# Patient Record
Sex: Male | Born: 1965
Health system: Southern US, Community
[De-identification: ages and names within clinical notes are randomized; demographics above are authoritative.]

## PROBLEM LIST (undated history)

## (undated) DIAGNOSIS — F1021 Alcohol dependence, in remission: Secondary | ICD-10-CM

## (undated) DIAGNOSIS — M1990 Unspecified osteoarthritis, unspecified site: Secondary | ICD-10-CM

## (undated) DIAGNOSIS — I10 Essential (primary) hypertension: Secondary | ICD-10-CM

## (undated) DIAGNOSIS — F1121 Opioid dependence, in remission: Secondary | ICD-10-CM

## (undated) DIAGNOSIS — F319 Bipolar disorder, unspecified: Secondary | ICD-10-CM

## (undated) HISTORY — PX: REPLACEMENT TOTAL KNEE: SUR1224

## (undated) HISTORY — PX: ROTATOR CUFF REPAIR: SHX139

---

## 1998-11-30 ENCOUNTER — Emergency Department (HOSPITAL_COMMUNITY): Admission: EM | Admit: 1998-11-30 | Discharge: 1998-11-30 | Payer: Self-pay | Admitting: Emergency Medicine

## 1999-02-10 ENCOUNTER — Encounter: Payer: Self-pay | Admitting: Emergency Medicine

## 1999-02-10 ENCOUNTER — Emergency Department (HOSPITAL_COMMUNITY): Admission: EM | Admit: 1999-02-10 | Discharge: 1999-02-10 | Payer: Self-pay | Admitting: Emergency Medicine

## 1999-07-25 ENCOUNTER — Emergency Department (HOSPITAL_COMMUNITY): Admission: EM | Admit: 1999-07-25 | Discharge: 1999-07-25 | Payer: Self-pay | Admitting: Emergency Medicine

## 2000-10-19 ENCOUNTER — Emergency Department (HOSPITAL_COMMUNITY): Admission: EM | Admit: 2000-10-19 | Discharge: 2000-10-19 | Payer: Self-pay | Admitting: Emergency Medicine

## 2000-12-21 ENCOUNTER — Emergency Department (HOSPITAL_COMMUNITY): Admission: EM | Admit: 2000-12-21 | Discharge: 2000-12-21 | Payer: Self-pay | Admitting: Emergency Medicine

## 2001-01-18 ENCOUNTER — Emergency Department (HOSPITAL_COMMUNITY): Admission: EM | Admit: 2001-01-18 | Discharge: 2001-01-18 | Payer: Self-pay | Admitting: Emergency Medicine

## 2003-01-24 ENCOUNTER — Encounter: Payer: Self-pay | Admitting: Emergency Medicine

## 2003-01-24 ENCOUNTER — Observation Stay (HOSPITAL_COMMUNITY): Admission: EM | Admit: 2003-01-24 | Discharge: 2003-01-25 | Payer: Self-pay

## 2003-01-26 ENCOUNTER — Encounter: Payer: Self-pay | Admitting: Family Medicine

## 2003-01-26 ENCOUNTER — Ambulatory Visit (HOSPITAL_COMMUNITY): Admission: RE | Admit: 2003-01-26 | Discharge: 2003-01-26 | Payer: Self-pay | Admitting: Family Medicine

## 2003-02-08 ENCOUNTER — Encounter: Admission: RE | Admit: 2003-02-08 | Discharge: 2003-02-08 | Payer: Self-pay | Admitting: Family Medicine

## 2003-09-13 ENCOUNTER — Emergency Department (HOSPITAL_COMMUNITY): Admission: EM | Admit: 2003-09-13 | Discharge: 2003-09-13 | Payer: Self-pay | Admitting: Emergency Medicine

## 2004-05-21 ENCOUNTER — Inpatient Hospital Stay (HOSPITAL_COMMUNITY): Admission: RE | Admit: 2004-05-21 | Discharge: 2004-05-24 | Payer: Self-pay | Admitting: Psychiatry

## 2004-07-12 ENCOUNTER — Emergency Department (HOSPITAL_COMMUNITY): Admission: EM | Admit: 2004-07-12 | Discharge: 2004-07-12 | Payer: Self-pay | Admitting: Emergency Medicine

## 2005-01-13 ENCOUNTER — Emergency Department (HOSPITAL_COMMUNITY): Admission: EM | Admit: 2005-01-13 | Discharge: 2005-01-14 | Payer: Self-pay | Admitting: Emergency Medicine

## 2005-01-14 ENCOUNTER — Ambulatory Visit: Payer: Self-pay | Admitting: Psychiatry

## 2005-01-14 ENCOUNTER — Inpatient Hospital Stay (HOSPITAL_COMMUNITY): Admission: EM | Admit: 2005-01-14 | Discharge: 2005-01-19 | Payer: Self-pay | Admitting: Psychiatry

## 2005-03-03 ENCOUNTER — Ambulatory Visit: Payer: Self-pay | Admitting: Psychiatry

## 2005-03-03 ENCOUNTER — Inpatient Hospital Stay (HOSPITAL_COMMUNITY): Admission: RE | Admit: 2005-03-03 | Discharge: 2005-03-08 | Payer: Self-pay | Admitting: Psychiatry

## 2005-03-03 ENCOUNTER — Emergency Department (HOSPITAL_COMMUNITY): Admission: EM | Admit: 2005-03-03 | Discharge: 2005-03-03 | Payer: Self-pay | Admitting: Emergency Medicine

## 2008-02-12 ENCOUNTER — Emergency Department (HOSPITAL_COMMUNITY): Admission: EM | Admit: 2008-02-12 | Discharge: 2008-02-12 | Payer: Self-pay | Admitting: Emergency Medicine

## 2008-03-18 ENCOUNTER — Emergency Department (HOSPITAL_COMMUNITY): Admission: EM | Admit: 2008-03-18 | Discharge: 2008-03-18 | Payer: Self-pay | Admitting: Emergency Medicine

## 2008-05-02 ENCOUNTER — Emergency Department (HOSPITAL_COMMUNITY): Admission: EM | Admit: 2008-05-02 | Discharge: 2008-05-02 | Payer: Self-pay | Admitting: Emergency Medicine

## 2008-12-15 ENCOUNTER — Emergency Department (HOSPITAL_COMMUNITY): Admission: EM | Admit: 2008-12-15 | Discharge: 2008-12-15 | Payer: Self-pay | Admitting: Emergency Medicine

## 2009-04-30 ENCOUNTER — Emergency Department (HOSPITAL_COMMUNITY): Admission: EM | Admit: 2009-04-30 | Discharge: 2009-04-30 | Payer: Self-pay | Admitting: Emergency Medicine

## 2009-05-01 ENCOUNTER — Ambulatory Visit (HOSPITAL_COMMUNITY): Payer: Self-pay | Admitting: Psychiatry

## 2009-05-10 ENCOUNTER — Ambulatory Visit (HOSPITAL_COMMUNITY): Payer: Self-pay | Admitting: Licensed Clinical Social Worker

## 2009-05-20 ENCOUNTER — Emergency Department (HOSPITAL_COMMUNITY): Admission: EM | Admit: 2009-05-20 | Discharge: 2009-05-20 | Payer: Self-pay | Admitting: Emergency Medicine

## 2009-05-24 ENCOUNTER — Ambulatory Visit (HOSPITAL_COMMUNITY): Payer: Self-pay | Admitting: Psychiatry

## 2009-05-30 ENCOUNTER — Ambulatory Visit (HOSPITAL_COMMUNITY): Payer: Self-pay | Admitting: Licensed Clinical Social Worker

## 2009-06-04 ENCOUNTER — Encounter: Admission: RE | Admit: 2009-06-04 | Discharge: 2009-06-04 | Payer: Self-pay | Admitting: Otolaryngology

## 2009-06-12 ENCOUNTER — Ambulatory Visit (HOSPITAL_COMMUNITY): Payer: Self-pay | Admitting: Licensed Clinical Social Worker

## 2009-06-19 ENCOUNTER — Ambulatory Visit (HOSPITAL_COMMUNITY): Payer: Self-pay | Admitting: Psychiatry

## 2009-06-21 ENCOUNTER — Inpatient Hospital Stay (HOSPITAL_COMMUNITY): Admission: EM | Admit: 2009-06-21 | Discharge: 2009-06-22 | Payer: Self-pay | Admitting: Emergency Medicine

## 2009-06-28 ENCOUNTER — Emergency Department (HOSPITAL_COMMUNITY): Admission: EM | Admit: 2009-06-28 | Discharge: 2009-06-28 | Payer: Self-pay | Admitting: Emergency Medicine

## 2009-07-03 ENCOUNTER — Encounter: Admission: RE | Admit: 2009-07-03 | Discharge: 2009-07-03 | Payer: Self-pay | Admitting: Gastroenterology

## 2009-07-03 ENCOUNTER — Ambulatory Visit (HOSPITAL_COMMUNITY): Payer: Self-pay | Admitting: Licensed Clinical Social Worker

## 2009-07-17 ENCOUNTER — Ambulatory Visit (HOSPITAL_COMMUNITY): Payer: Self-pay | Admitting: Licensed Clinical Social Worker

## 2009-07-28 ENCOUNTER — Emergency Department (HOSPITAL_COMMUNITY): Admission: EM | Admit: 2009-07-28 | Discharge: 2009-07-28 | Payer: Self-pay | Admitting: Emergency Medicine

## 2009-08-04 ENCOUNTER — Emergency Department (HOSPITAL_COMMUNITY): Admission: EM | Admit: 2009-08-04 | Discharge: 2009-08-04 | Payer: Self-pay | Admitting: Emergency Medicine

## 2009-08-07 ENCOUNTER — Ambulatory Visit (HOSPITAL_COMMUNITY): Payer: Self-pay | Admitting: Licensed Clinical Social Worker

## 2009-08-13 ENCOUNTER — Emergency Department (HOSPITAL_COMMUNITY): Admission: EM | Admit: 2009-08-13 | Discharge: 2009-08-13 | Payer: Self-pay | Admitting: Family Medicine

## 2009-08-20 ENCOUNTER — Encounter: Payer: Self-pay | Admitting: *Deleted

## 2009-08-20 DIAGNOSIS — K319 Disease of stomach and duodenum, unspecified: Secondary | ICD-10-CM | POA: Insufficient documentation

## 2009-08-20 DIAGNOSIS — Z87442 Personal history of urinary calculi: Secondary | ICD-10-CM | POA: Insufficient documentation

## 2009-08-20 DIAGNOSIS — F319 Bipolar disorder, unspecified: Secondary | ICD-10-CM | POA: Insufficient documentation

## 2009-08-20 DIAGNOSIS — F1021 Alcohol dependence, in remission: Secondary | ICD-10-CM

## 2009-08-20 DIAGNOSIS — Z96659 Presence of unspecified artificial knee joint: Secondary | ICD-10-CM

## 2009-08-20 DIAGNOSIS — I10 Essential (primary) hypertension: Secondary | ICD-10-CM | POA: Insufficient documentation

## 2009-08-20 DIAGNOSIS — Z9889 Other specified postprocedural states: Secondary | ICD-10-CM | POA: Insufficient documentation

## 2009-08-21 ENCOUNTER — Ambulatory Visit (HOSPITAL_COMMUNITY): Payer: Self-pay | Admitting: Psychiatry

## 2009-08-21 ENCOUNTER — Ambulatory Visit: Payer: Self-pay | Admitting: Pulmonary Disease

## 2009-08-21 DIAGNOSIS — J309 Allergic rhinitis, unspecified: Secondary | ICD-10-CM | POA: Insufficient documentation

## 2009-08-21 DIAGNOSIS — R519 Headache, unspecified: Secondary | ICD-10-CM | POA: Insufficient documentation

## 2009-08-21 DIAGNOSIS — R51 Headache: Secondary | ICD-10-CM

## 2009-08-21 DIAGNOSIS — G4700 Insomnia, unspecified: Secondary | ICD-10-CM

## 2009-08-23 ENCOUNTER — Ambulatory Visit (HOSPITAL_COMMUNITY): Payer: Self-pay | Admitting: Licensed Clinical Social Worker

## 2009-09-20 ENCOUNTER — Ambulatory Visit (HOSPITAL_COMMUNITY): Payer: Self-pay | Admitting: Licensed Clinical Social Worker

## 2009-10-02 ENCOUNTER — Ambulatory Visit (HOSPITAL_COMMUNITY): Payer: Self-pay | Admitting: Psychiatry

## 2009-10-10 ENCOUNTER — Ambulatory Visit (HOSPITAL_COMMUNITY): Payer: Self-pay | Admitting: Licensed Clinical Social Worker

## 2009-10-30 ENCOUNTER — Ambulatory Visit (HOSPITAL_COMMUNITY): Payer: Self-pay | Admitting: Psychiatry

## 2009-12-16 ENCOUNTER — Ambulatory Visit (HOSPITAL_COMMUNITY): Payer: Self-pay | Admitting: Psychiatry

## 2010-02-17 ENCOUNTER — Ambulatory Visit (HOSPITAL_COMMUNITY): Payer: Self-pay | Admitting: Psychiatry

## 2010-03-25 ENCOUNTER — Emergency Department (HOSPITAL_COMMUNITY): Admission: EM | Admit: 2010-03-25 | Discharge: 2010-03-25 | Payer: Self-pay | Admitting: Emergency Medicine

## 2010-04-11 ENCOUNTER — Ambulatory Visit (HOSPITAL_COMMUNITY): Payer: Self-pay | Admitting: Psychiatry

## 2010-05-14 ENCOUNTER — Encounter: Admission: RE | Admit: 2010-05-14 | Discharge: 2010-05-14 | Payer: Self-pay | Admitting: *Deleted

## 2010-07-02 ENCOUNTER — Ambulatory Visit (HOSPITAL_COMMUNITY): Payer: Self-pay | Admitting: Psychiatry

## 2010-10-11 IMAGING — CR DG LUMBAR SPINE COMPLETE 4+V
5 series · 5 of 5 positions shown · non-contrast
Comparison: None

CLINICAL DATA: Low back pain.  ER patient.

LUMBAR SPINE - COMPLETE 4+ VIEW

[t l-spine a.p.]
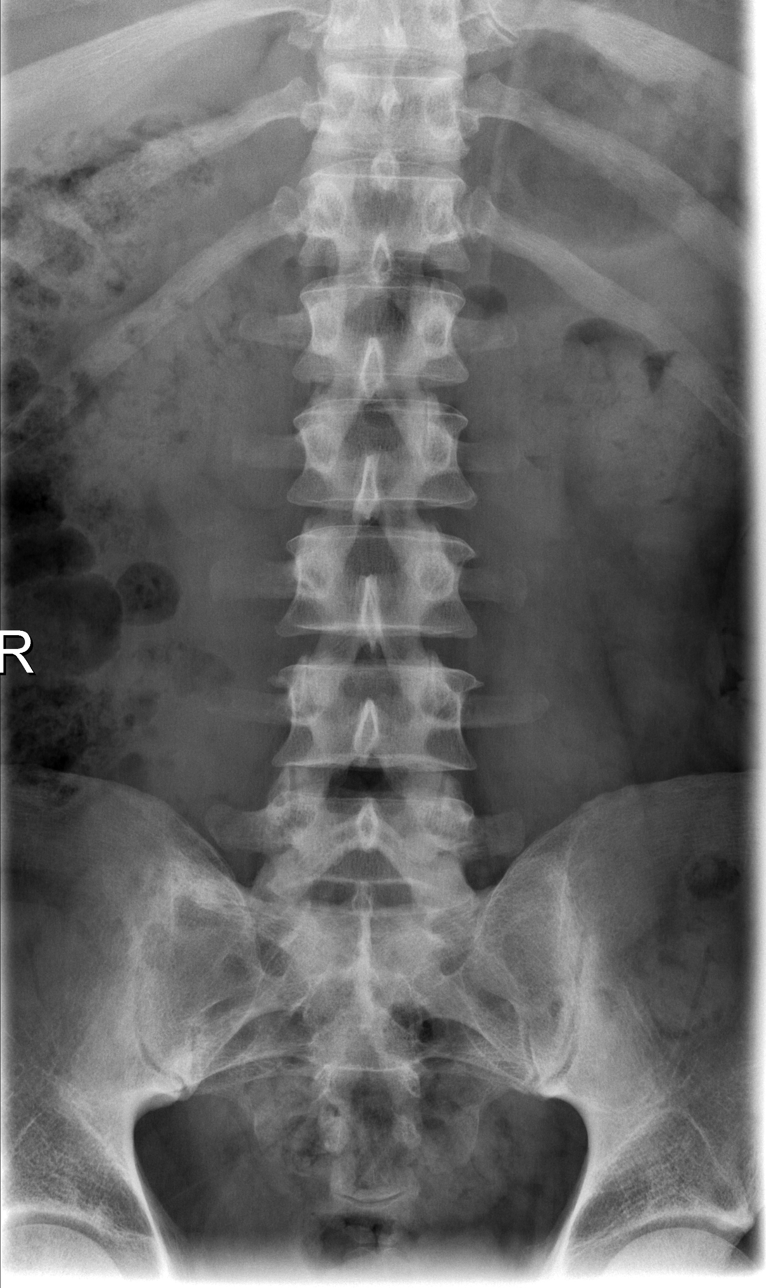

[t l-spine oblique exposure (1 of 2)]
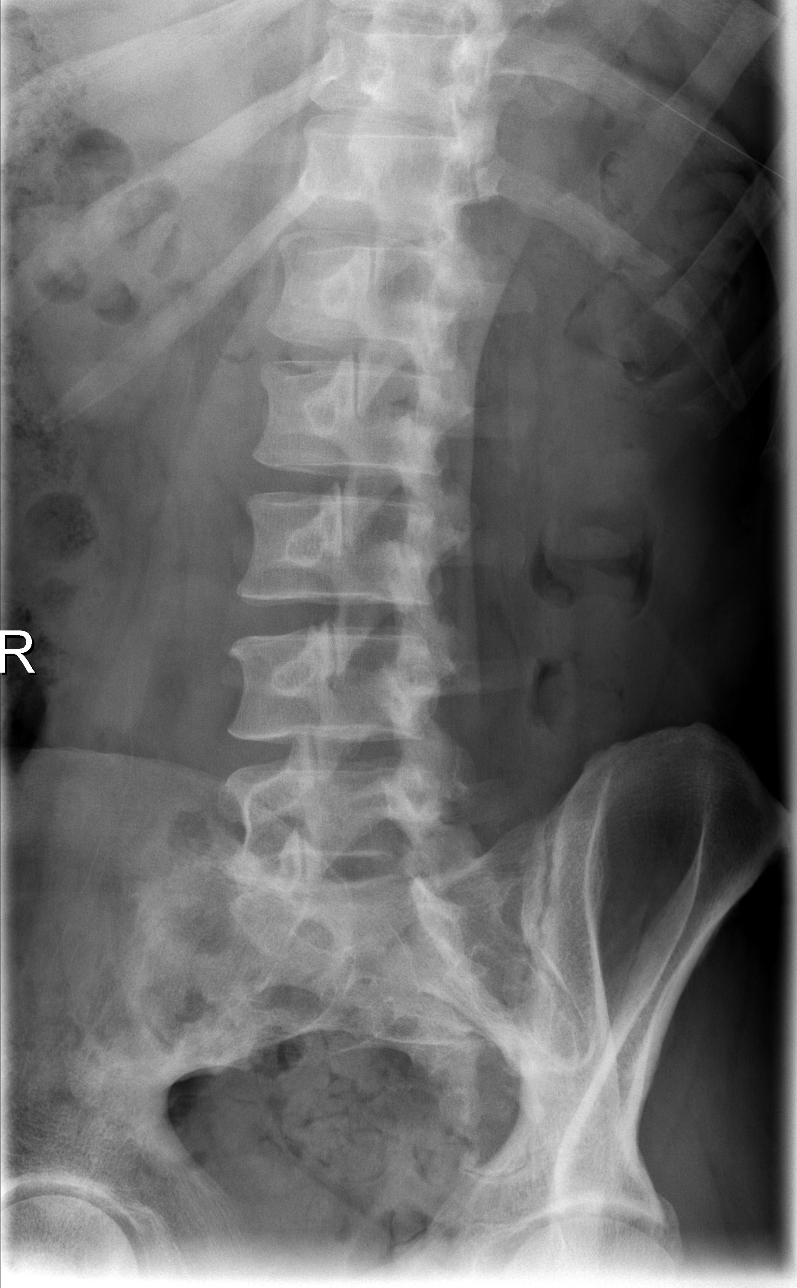

[t l-spine oblique exposure (2 of 2)]
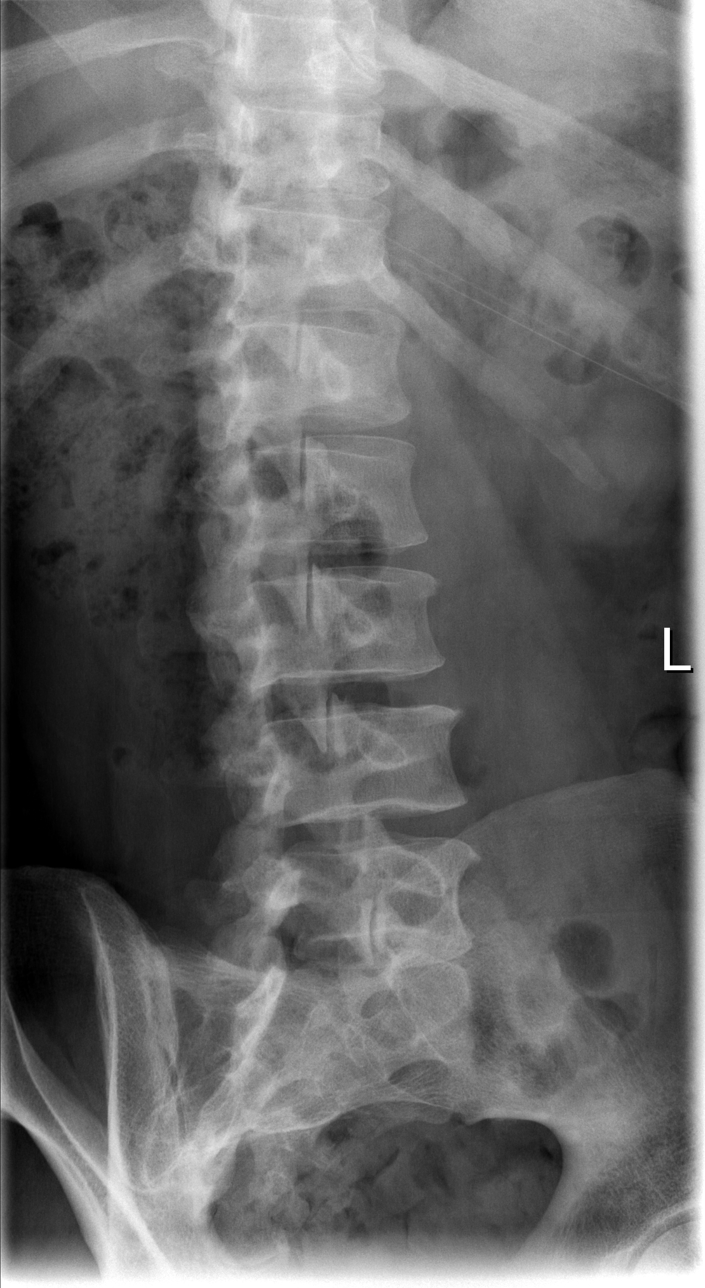

[t l-spine lat]
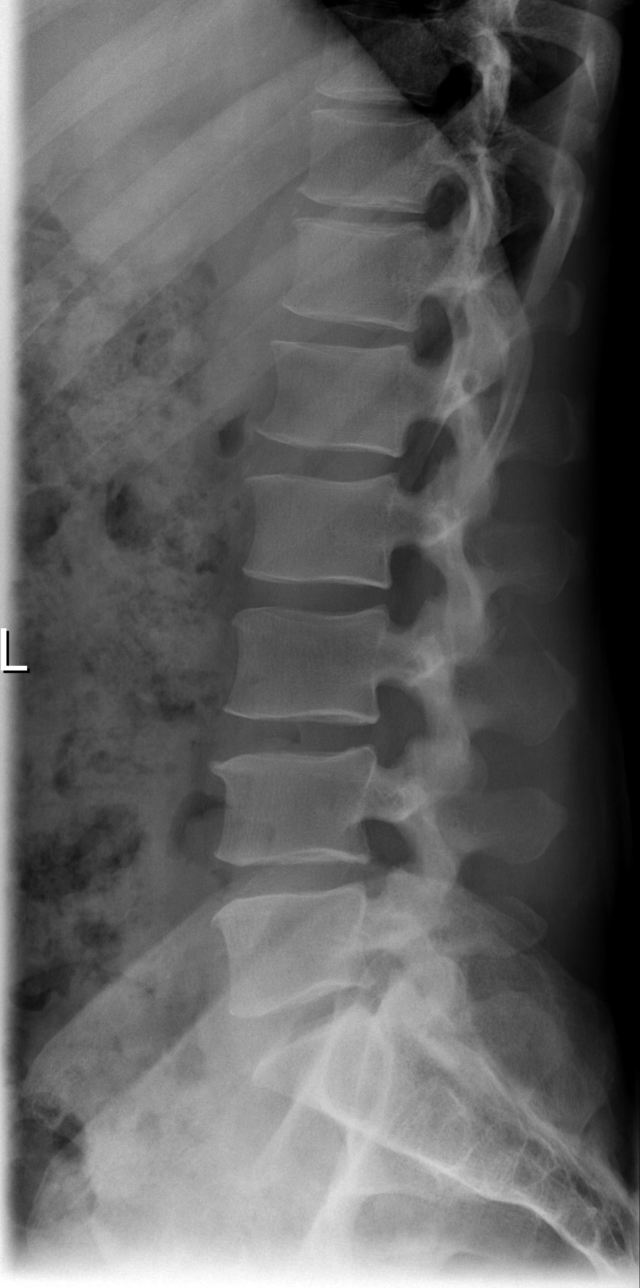

[t l-spine l5-s1 spot]
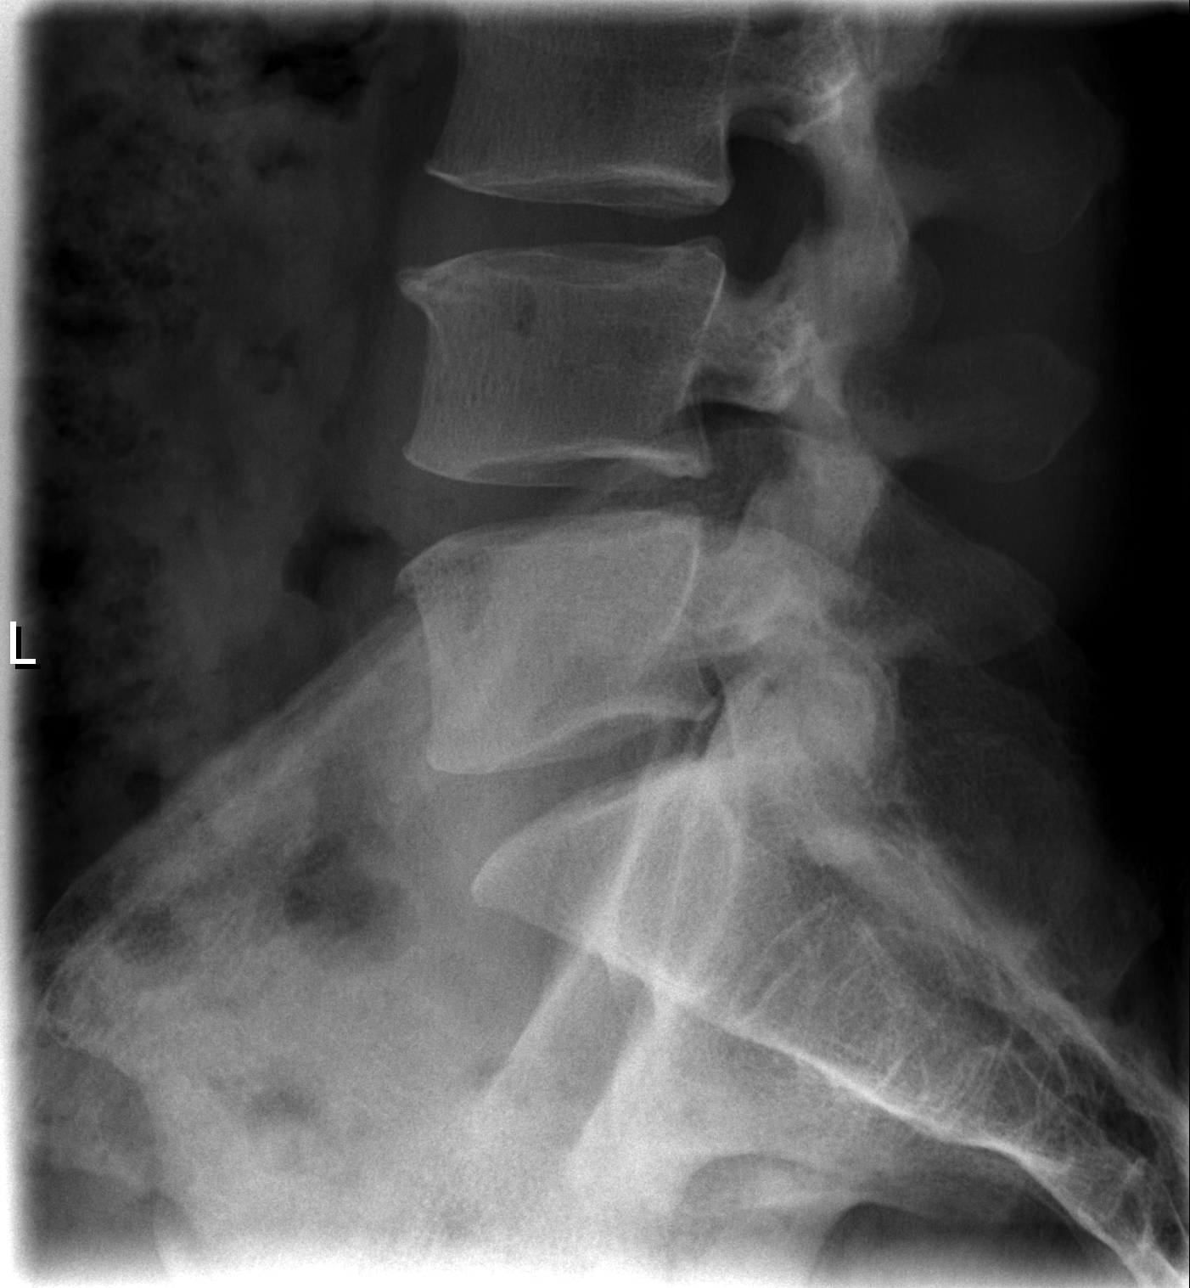

[5 of 5 positions shown; findings below may reference images not displayed]

FINDINGS: There is no evidence of lumbar spine fracture.  Alignment
is normal.  Intervertebral disc spaces are maintained.
IMPRESSION: Negative.

## 2010-10-15 ENCOUNTER — Ambulatory Visit (HOSPITAL_COMMUNITY): Payer: Self-pay | Admitting: Psychiatry

## 2010-12-08 ENCOUNTER — Ambulatory Visit (HOSPITAL_COMMUNITY): Payer: Self-pay | Admitting: Psychiatry

## 2011-02-04 ENCOUNTER — Encounter (INDEPENDENT_AMBULATORY_CARE_PROVIDER_SITE_OTHER): Payer: BC Managed Care – PPO | Admitting: Psychiatry

## 2011-02-04 DIAGNOSIS — F39 Unspecified mood [affective] disorder: Secondary | ICD-10-CM

## 2011-03-22 LAB — COMPREHENSIVE METABOLIC PANEL
Alkaline Phosphatase: 69 U/L (ref 39–117)
BUN: 15 mg/dL (ref 6–23)
CO2: 27 mEq/L (ref 19–32)
Chloride: 104 mEq/L (ref 96–112)
GFR calc Af Amer: >60 mL/min (ref >60)
Sodium: 135 mEq/L (ref 135–145)
Total Protein: 7.1 g/dL (ref 6.0–8.3)

## 2011-03-22 LAB — CBC
HCT: 46.7 % (ref 39.0–52.0)
Hemoglobin: 15.7 g/dL (ref 13.0–17.0)
MCHC: 33.6 g/dL (ref 30.0–36.0)
Platelets: 205 10*3/uL (ref 150–400)
RDW: 12.3 % (ref 11.5–15.5)
WBC: 8.3 10*3/uL (ref 4.0–10.5)

## 2011-03-22 LAB — URINALYSIS, ROUTINE W REFLEX MICROSCOPIC
Bilirubin Urine: NEGATIVE
Ketones, ur: NEGATIVE mg/dL
Nitrite: NEGATIVE
pH: 6.5 (ref 5.0–8.0)

## 2011-03-22 LAB — DIFFERENTIAL
Basophils Relative: 1 % (ref 0–1)
Eosinophils Relative: 3 % (ref 0–5)
Neutrophils Relative %: 60 % (ref 43–77)

## 2011-03-22 LAB — PROTIME-INR
INR: 0.96 (ref 0.00–1.49)
Prothrombin Time: 12.7 seconds (ref 11.6–15.2)

## 2011-03-22 LAB — LIPASE, BLOOD: Lipase: 56 U/L (ref 11–59)

## 2011-03-22 LAB — HEMOCCULT GUIAC POC 1CARD (OFFICE): Fecal Occult Bld: NEGATIVE

## 2011-04-05 LAB — COMPREHENSIVE METABOLIC PANEL
AST: 24 U/L (ref 0–37)
Albumin: 3.9 g/dL (ref 3.5–5.2)
Creatinine, Ser: 0.96 mg/dL (ref 0.4–1.5)
GFR calc Af Amer: >60 mL/min (ref >60)
Glucose, Bld: 98 mg/dL (ref 70–99)
Total Protein: 7.2 g/dL (ref 6.0–8.3)

## 2011-04-05 LAB — POCT CARDIAC MARKERS
CKMB, poc: 1.3 ng/mL (ref 1.0–8.0)
Myoglobin, poc: 56.2 ng/mL (ref 12–200)
Troponin i, poc: <0.05 ng/mL (ref 0.00–0.09)

## 2011-04-05 LAB — CBC
Hemoglobin: 15.3 g/dL (ref 13.0–17.0)
MCHC: 34.4 g/dL (ref 30.0–36.0)
MCV: 88.7 fL (ref 78.0–100.0)
Platelets: 183 10*3/uL (ref 150–400)
RDW: 13.4 % (ref 11.5–15.5)
WBC: 7.7 10*3/uL (ref 4.0–10.5)

## 2011-04-05 LAB — DIFFERENTIAL
Lymphocytes Relative: 23 % (ref 12–46)
Monocytes Relative: 7 % (ref 3–12)
Neutro Abs: 5.2 10*3/uL (ref 1.7–7.7)

## 2011-04-05 LAB — LIPASE, BLOOD: Lipase: 36 U/L (ref 11–59)

## 2011-04-06 LAB — URINALYSIS, ROUTINE W REFLEX MICROSCOPIC
Glucose, UA: NEGATIVE mg/dL
Hgb urine dipstick: NEGATIVE
Ketones, ur: NEGATIVE mg/dL
Protein, ur: NEGATIVE mg/dL
Urobilinogen, UA: 0.2 mg/dL (ref 0.0–1.0)

## 2011-04-06 LAB — COMPREHENSIVE METABOLIC PANEL
ALT: 23 U/L (ref 0–53)
AST: 24 U/L (ref 0–37)
Albumin: 3.1 g/dL — ABNORMAL LOW (ref 3.5–5.2)
Albumin: 3.9 g/dL (ref 3.5–5.2)
Alkaline Phosphatase: 44 U/L (ref 39–117)
BUN: 28 mg/dL — ABNORMAL HIGH (ref 6–23)
Calcium: 9.5 mg/dL (ref 8.4–10.5)
Chloride: 106 mEq/L (ref 96–112)
Chloride: 113 mEq/L — ABNORMAL HIGH (ref 96–112)
Creatinine, Ser: 1.06 mg/dL (ref 0.4–1.5)
GFR calc Af Amer: >60 mL/min (ref >60)
Glucose, Bld: 92 mg/dL (ref 70–99)
Potassium: 4.4 mEq/L (ref 3.5–5.1)
Sodium: 144 mEq/L (ref 135–145)
Total Bilirubin: 0.7 mg/dL (ref 0.3–1.2)
Total Bilirubin: 0.8 mg/dL (ref 0.3–1.2)
Total Protein: 5.5 g/dL — ABNORMAL LOW (ref 6.0–8.3)
Total Protein: 6.6 g/dL (ref 6.0–8.3)

## 2011-04-06 LAB — LIPID PANEL
HDL: 34 mg/dL — ABNORMAL LOW (ref >39)
LDL Cholesterol: 91 mg/dL (ref 0–99)
Total CHOL/HDL Ratio: 4.1 RATIO
Triglycerides: 77 mg/dL (ref <150)
VLDL: 15 mg/dL (ref 0–40)

## 2011-04-06 LAB — DIFFERENTIAL
Basophils Absolute: 0 10*3/uL (ref 0.0–0.1)
Basophils Absolute: 0.1 10*3/uL (ref 0.0–0.1)
Basophils Relative: 1 % (ref 0–1)
Eosinophils Absolute: 0.2 10*3/uL (ref 0.0–0.7)
Eosinophils Relative: 2 % (ref 0–5)
Lymphocytes Relative: 20 % (ref 12–46)
Lymphs Abs: 1.7 10*3/uL (ref 0.7–4.0)
Monocytes Absolute: 0.5 10*3/uL (ref 0.1–1.0)
Monocytes Absolute: 0.6 10*3/uL (ref 0.1–1.0)
Monocytes Relative: 6 % (ref 3–12)
Neutro Abs: 6.1 10*3/uL (ref 1.7–7.7)

## 2011-04-06 LAB — CBC
HCT: 43.9 % (ref 39.0–52.0)
HCT: 45.4 % (ref 39.0–52.0)
Hemoglobin: 14.8 g/dL (ref 13.0–17.0)
MCHC: 34.1 g/dL (ref 30.0–36.0)
MCV: 88.1 fL (ref 78.0–100.0)
Platelets: 150 10*3/uL (ref 150–400)
Platelets: 169 10*3/uL (ref 150–400)
RDW: 13.5 % (ref 11.5–15.5)
RDW: 13.7 % (ref 11.5–15.5)
WBC: 8.6 10*3/uL (ref 4.0–10.5)
WBC: 8.8 10*3/uL (ref 4.0–10.5)

## 2011-04-06 LAB — GLUCOSE, CAPILLARY: Glucose-Capillary: 104 mg/dL — ABNORMAL HIGH (ref 70–99)

## 2011-04-06 LAB — AMYLASE: Amylase: 63 U/L (ref 27–131)

## 2011-05-12 NOTE — H&P (Signed)
NAMEWINTER, TREFZ NO.:  [id number]   MEDICAL RECORD NO.:  [id number]          PATIENT TYPE:  EMS   LOCATION:  ED                           FACILITY:  [facility]   PHYSICIAN:  Oswald Hillock, MD        DATE OF BIRTH:  10-Mar-1966   DATE OF ADMISSION:  06/21/2009  DATE OF DISCHARGE:                              HISTORY & PHYSICAL   CHIEF COMPLAINT:  Abdominal pain.   HISTORY OF PRESENT ILLNESS:  The patient is a 45 year old Caucasian male  with known history of bipolar disorder and nephrolithiasis who presents  to the emergency room with complaint of abdominal pain.  The pain  started yesterday afternoon and he has had what he describes abdominal  cramping continuously ever since he had an incomplete bowel movement but  no change in the character of the stool.  He complaints of some  dizziness, some diaphoresis and continued nausea and weakness.  Did not  have anything to eat this morning.  He denies any fever, rigors, chills,  chest pain, shortness of breath, bleeding PR, any loss of consciousness  or any focal weakness of any part of the body.  No urinary symptoms  reported by the patient either.   EMERGENCY ROOM COURSE:  The patient's initial evaluation in the ER  revealed a low blood pressure after which he received intravenous fluids  and at the time of this interview his blood pressure is 100/60 with a  heart rate in the 40s.  He was noted to be sinus brady on his EKG.   PAST MEDICAL HISTORY:  1. Bipolar disorder.  2. Nephrolithiasis.  3. History of alcohol dependence in the past though the patient      currently denies any history of alcohol use.  4. History of stomach ulcers although no documentation of that in our      records.  5. There is documentation of previous history of hypertension and the      patient was taking hydrochlorothiazide from 2001-2003.  No current      history of that.   PAST SURGICAL HISTORY:  History of right rotator cuff  repair.   CURRENT MEDICATIONS:  1. Lithium.  2. Seroquel.   ALLERGIES:  No known drug allergies.   FAMILY HISTORY:  No history of hypertension, coronary artery disease,  diabetes mellitus or bipolar disorder in the family.   SOCIAL HISTORY:  The patient denies any alcohol, tobacco or drug use.  Lives with family.   REVIEW OF SYSTEMS:  Extensive systems are negative except for the  positives as mentioned in history of present illness.   PHYSICAL EXAMINATION:  VITAL SIGNS:  On admission pulse 68, blood  pressure 123/44, respiratory rate of 18, O2 saturation of 99% on room  air, temperature 97.6.  GENERAL:  The patient is conscious, alert, oriented to time, place and  person, in no acute distress.  HEENT:  No scleral icterus.  No pallor.  Ears negative.  Poor dental  hygiene.  NECK:  Supple.  No lymphadenopathy.  No JVD.  No carotid bruit.  CHEST:  CTA bilaterally.  CARDIOVASCULAR:  S1, S2, plus irregular bradycardia.  No gallop, rub or  murmur appreciated.  ABDOMEN:  Soft.  There is deep tenderness in the periumbilical region,  however, no guarding noted.  Bowel sounds present in all four quadrants.  EXTREMITIES:  No cyanosis, clubbing or edema.  NEUROLOGICAL:  Cranial nerves II-XII are grossly intact.  No focal motor  or sensory deficits noted on gross examination.  PSYCHIATRIC:  The patient has a flat affect.  No delusions.  No  hallucinations.  No suicidal or homicidal ideation.   LABORATORY DATA:  1. EKG showed sinus bradycardia, rate of about 54, normal PR interval,      normal QRS duration.  No previous EKGs available for comparison.  2. CT abdomen and pelvis showed no acute findings, degenerative      disease of the right hip.  3. Sodium 141, potassium 3.9, chloride 106, CO2 29, glucose 57, BUN      28, creatinine 1.06, alkaline phosphatase of 56, lipase 139.      Urinalysis was negative.  WBC count was 8.6, hemoglobin 15.5,      hematocrit 45.4, platelet count of  169, lipase was 139.   IMPRESSION AND PLAN:  This is a case of a 45 year old Caucasian male  with history of bipolar disorder and nephrolithiasis who presents with  abdominal pain.  1. Abdominal pain likely mild pancreatitis based on elevated lipase      levels.  However, he has a normal CAT scan and no significant      findings on abdominal exam.  Given his continued symptoms and the      fact that he was hypertensive in the ER we will admit him for      observation, keep him n.p.o. except meds, maintain IV fluids with      normal saline at 200 mL an hour and monitor labs.  We will consult      GI for their opinion prior to his discharge and follow up with      their recommendations.  2. The patient will be maintained on Zofran 4 mg q.8 hours p.r.n. for      nausea and we will use Dilaudid 0.5 mg IV q.4 hours p.r.n. for pain      control.  3. Bipolar disorder.  His lithium level is low.  We will continue      lithium 50 mg twice a day.  The patient seems to be stable at this      time.  We will also continue Seroquel at 50 mg daily.  4. DVT prophylaxis, Protonix and no need for any DVT prophylaxis at      this time.      Oswald Hillock, MD  Electronically Signed     BA/MEDQ  D:  06/21/2009  T:  06/21/2009  Job:  [id number]

## 2011-05-12 NOTE — Discharge Summary (Signed)
Mathew Gonzalez, STOFKO NO.:  [id number]   MEDICAL RECORD NO.:  [id number]         PATIENT TYPE:  LINP   LOCATION:                               FACILITY[facility]  [facility]   PHYSICIAN:  Eduard Clos, MDDATE OF BIRTH:  09/07/1966   DATE OF ADMISSION:  06/21/2009  DATE OF DISCHARGE:  06/22/2009                               DISCHARGE SUMMARY   HOSPITAL COURSE:  A 45 year old male with known history of bipolar  disorder and nephrolithiasis who presented with abdominal pain.  CAT  scan of the abdomen and pelvis did not show any acute findings.  The  patient had mildly elevated lipase of 139.  The patient was admitted to  medical floor and was observed.  The following day the patient's pain  had significantly improved and, as the patient was tolerating diet, the  patient was discharged home.   PROCEDURES DONE DURING THE STAY:  1. CT of abdomen and pelvis on June 21, 2009, showed negative abdomen      CT scan.  No findings significant for the patient's symptoms.  No      acute findings in the pelvis.  Negative for appendicitis.      Degenerative disk disease at the right hip noted.  2. Labs:  Lipase on admission 139, the following day it was 37.   FINAL DIAGNOSES:  1. Possible pancreatitis.  2. History of bipolar disorder.   MEDICATIONS ON DISCHARGE:  1. Lithium carbonate 450 mg twice daily.  2. Oxycodone 5 mg p.o.  p.r.n. for pain.   The patient discharged home and advised to follow with his primary care  physician within a week's time.      Eduard Clos, MD  Electronically Signed     ANK/MEDQ  D:  07/17/2009  T:  07/18/2009  Job:  [id number]

## 2011-05-15 NOTE — Discharge Summary (Signed)
NAMEANAV, LAMMERT NO.:  [id number]   MEDICAL RECORD NO.:  [id number]          PATIENT TYPE:  IPS   LOCATION:  0305                          [facility]ACILITY:  [facility]   PHYSICIAN:  Jeanice Lim, M.D. DATE OF BIRTH:  [birth year]   DATE OF ADMISSION:  03/03/2005  DATE OF DISCHARGE:  03/08/2005                                 DISCHARGE SUMMARY   IDENTIFYING DATA:  This is a 45 year old single Caucasian male voluntarily  admitted for history of alcohol abuse relapse last week after being sober  for 6 weeks.  He had been drinking a fifth a day.  Last drink was the day  prior to admission, feeling depressed, suicidal, no specific plan.  He  reports compliance with medications, although drinking a large quantity,  therefore, may be unreliable.  Third admission, here in July 2006 with  suicidality and alcohol dependence, needing detox, having been followed up  by Dr. Tomasa Rand and Dr. Dub Mikes.  He had been on Terazol, Paxil and  Wellbutrin in the past.   MEDICATIONS:  Eskalith.  Wellbutrin.  Protonix.  Claritin.  Risperdal.  Seroquel.  Trazodone.   DRUG ALLERGIES:  No known drug allergies.   PHYSICAL EXAMINATION:  Physical and neurologic exam within normal limits.   ROUTINE ADMISSION LABS:  Within normal limits.   MENTAL STATUS EXAM:  Fully alert, disheveled male, cooperative, good eye  contact.  Speech clear.  Mood tired fatigued.  Thought process goal  directed.  Thought content negative for dangerous ideation or psychotic  symptoms.  Judgment and insight were fair.   ADMISSION DIAGNOSES:   AXIS I:  1.  Bipolar disorder.  2.  Alcohol dependence.   AXIS II:  Deferred.   AXIS III:  None.   AXIS IV:  Moderate problems with psychosocial issues including limited  support system and financial stress.   AXIS V:  35/65.   HOSPITAL COURSE:  Patient was admitted, ordered routine p.r.n. medications,  underwent further monitoring, was encouraged to participate in  individual,  group and milieu therapy.  He require detox protocol for safety.  He worked  on relapse Astronomer.  Participated in substance abuse therapy as well  as aftercare planning and importance of compliance with medications,  aftercare and working 100% on relapse prevention plan were reviewed with  patient on several occasions.  Employer was contacted at patient's request  regarding limited details but no defying of patient's location.  Patient  reported improved mood stability, resolution of withdrawal symptoms and was  discharged in improved condition with no dangerous ideations or psychotic  symptoms.   DISCHARGE MEDICATIONS:  Patient was given medication education on multiple  occasions and was discharged on  1.  Eskalith CR 450 q.12h.  2.  Wellbutrin XL 150 mg q.a.m.  3.  Seroquel 100 mg 2-1/2 tablets q.h.s.  4.  Neurontin 200 mg 1 t.i.d. and 2 q.h.s.   FOLLOW UP:  He is to follow up with Lucretia Roers on Monday, March 09, 2005, at [facility], and also on Monday, March 09, 2005, at 4:00 p.m.  CD  IOP starting 6:00 to 9:00 p.m., and to see Dr. Dub Mikes Wednesday, March 18, 2005.   DISCHARGE DIAGNOSES:   AXIS I:  1.  Bipolar disorder.  2.  Alcohol dependence.   AXIS II:  Deferred.   AXIS III:  None.   AXIS IV:  Moderate problems with psychosocial issues including limited  support system and financial stress.   AXIS V:  Global assessment of function on discharge 55-60.      JEM/MEDQ  D:  04/13/2005  T:  04/14/2005  Job:  [id number]

## 2011-05-15 NOTE — Discharge Summary (Signed)
NAME:  Mathew Gonzalez, Mathew Gonzalez                          ACCOUNT NO.:  [id number]   MEDICAL RECORD NO.:  [id number]                   PATIENT TYPE:  IPS   LOCATION:  0505                                 [facility]ILITY[facility]  [facility]   PHYSICIAN:  Geoffery Lyons, M.D.                   DATE OF BIRTH:  1966/03/22   DATE OF ADMISSION:  05/21/2004  DATE OF DISCHARGE:  05/24/2004                                 DISCHARGE SUMMARY   CHIEF COMPLAINT AND PRESENT ILLNESS:  This was the first admission to [facility] for this 45 year old single white male voluntarily  admitted.  History of alcohol abuse.  He wants help to stop drinking.  Feeling very depressed, denying any suicidal ideation.  He lost over 20  pounds with decreased sleep, no specific stressors, no hallucinations.  Felt  like he needs an antidepressant and has been on that at one point in time  but had side effects.  Currently experiencing withdrawal symptoms.   PAST PSYCHIATRIC HISTORY:  First time at [facility].  Second  detoxification.  Detoxified back in 1990.  Longest history of sobriety has  been three years.   SUBSTANCE ABUSE HISTORY:  Drinks vodka, a pint or more daily.  Drinking in  the morning.  Last drink was on May 21, 2004.  Drinking for years.  Denies  any blackouts or seizure activities.   PAST MEDICAL HISTORY:  Noncontributory.   MEDICATIONS:  Has been on Serzone, Paxil and Wellbutrin in the past.  None  currently.   LABORATORY DATA:  CBC within normal limits.  Blood chemistry within normal  limits.  SGOT 88, SGPT 108.  TSH within normal limits.  Drug screen negative  for substances of abuse.   MENTAL STATUS EXAM:  Alert, cooperative male with fair eye contact.  Speech  was soft-spoken, more relevant.  Mood was depressed.  Affect was depressed.  Thought processes were coherent.  There appears to be no evidence of  psychosis.  Cognition well-preserved.   ADMISSION DIAGNOSES:   AXIS I:  1. Alcohol  dependence.  2. Depressive disorder not otherwise specified.   AXIS II:  No diagnosis.   AXIS III:  No diagnosis.   AXIS IV:  Moderate.   AXIS V:  Global Assessment of Functioning upon admission 35; highest Global  Assessment of Functioning in the last year 65.   HOSPITAL COURSE:  He was admitted and started intensive individual and group  psychotherapy.  He was given Librium for detox.  He was given some Ambien  for sleep.  He was started on Cymbalta 20 mg per day, that was increased up  to 30 mg.  Endorsed use of alcohol for many years, sober in 1990 for three  years and then in 1998 for a few months.  Heavy drinking secondary to stress  at job, rough relationship with the girlfriend.  Has been on  antidepressants, Prozac, Serzone, Paxil, Wellbutrin and Zoloft.  Stopped  secondary to side effects.  Endorsed feeling a lot of guilt, decreased self-  esteem, withdrawn, hopeless, helpless.  He continued to be depressed,  tearful, dysphoric, withdrawn, some withdrawal symptoms that started  resolving.  He was started on Cymbalta.  By May 24, 2004, he felt he was  feeling better.  He wanted to be back at work.  Endorsed increased insight  in terms of the need to abstain.  Was going to work on a plan for long-term  abstinence.  He was going to stay with the parents.  Will go to meetings.  As he was not suicidal or homicidal and there was no acute withdrawal, we  went ahead and discharged to outpatient follow-up.   DISCHARGE DIAGNOSES:   AXIS I:  1. Alcohol dependence.  2. Depressive disorder not otherwise specified.   AXIS II:  No diagnosis.   AXIS III:  No diagnosis.   AXIS IV:  Moderate.   AXIS V:  Global Assessment of Functioning upon discharge 50.   DISCHARGE MEDICATIONS:  1. Protonix 40 mg per day.  2. Cymbalta 30 mg per day.  3. Antabuse 250 mg daily.  4. Trazodone 100 mg, 1-2 at night.   FOLLOW UP:  Jasmine Pang, M.D.                                                Geoffery Lyons, M.D.    IL/MEDQ  D:  06/17/2004  T:  06/19/2004  Job:  [id number]

## 2011-05-15 NOTE — H&P (Signed)
Mathew Gonzalez, WAGES                ACCOUNT NO.:  [id number]   MEDICAL RECORD NO.:  [id number]          PATIENT TYPE:  IPS   LOCATION:  0305                          [facility]ILITY[facility]  [facility]   PHYSICIAN:  Syed T. Arfeen, M.D.   DATE OF BIRTH:  11/13/1966   DATE OF ADMISSION:  03/03/2005  DATE OF DISCHARGE:                         PSYCHIATRIC ADMISSION ASSESSMENT   IDENTIFYING INFORMATION:  A 45 year old single white male voluntarily  admitted on 03/03/05.   HISTORY OF PRESENT ILLNESS:  Patient presents with a history of alcohol  abuse.  Patient relapsed last week after being sober for approximately 6  weeks.  Patient has been drinking liquor, drinking a fifth daily.  His last  drink was on 03/03/05 at 7:00 in the morning.  Patient does report that he has  been drinking in the early morning hours.  He feels depressed with suicidal  thoughts, no specific plan.  He reports he has been compliant with his  medications.  His sleep has been satisfactory.  Appetite has been  satisfactory.  He denies any psychotic symptoms.   PAST PSYCHIATRIC HISTORY:  Third admission, patient was here in January 2006  for suicidal thoughts and alcohol abuse.  He sees Dr. Dub Mikes on an outpatient  basis, no history of a suicide attempt.  His longest history of sobriety had  been 3 years which was 32 to 1997.  In the past patient has been on  Serzone, Paxil, and Wellbutrin.   SOCIAL HISTORY:  This is a 45 year old single white male with 2 children who  are ages 41 and 73.  He lives alone.  He works in a [employer].  No legal  problems and reports a supportive family.   FAMILY HISTORY:  History of alcohol use.   ALCOHOL OR DRUG HISTORY:  Patient is a nonsmoker.  Alcohol use as above.  Drinking since the age of 39.  Drinks in the morning with blackouts, no  seizures.  He has had withdrawal symptoms in the past.  He denies any drug  use.   PRIMARY CARE PHYSICIAN:  None.   MEDICAL PROBLEMS:  Headaches.   MEDICATIONS:   Patient was discharged in January on:  1.  Eskalith CR 450 b.i.d.  2.  Wellbutrin XL 300 mg in the morning.  3.  Protonix 40 mg daily.  4.  Claritin 10 mg daily.  5.  Risperdal 0.5 mg in the morning and 1.5 mg at bedtime.  6.  Seroquel 200 mg at h.s.  7.  Trazodone 100 mg at h.s.   DRUG ALLERGIES:  No known drug allergies.   PHYSICAL EXAMINATION:  Patient was assessed at [facility] where he received  1 gm of Tylenol and Dilaudid 4 mg for a headache.  He is a 45 year old male  in no acute distress, unkempt but no tremors.  His temperature 99, heart  rate 81, respirations 20, blood pressure 141/98.  He is 98% saturated.  Alcohol level is 164.  Urine drug screen was negative.  Blood sugar is 118.  Lithium level was less than 0.25.  TSH is 6.257.   MENTAL  STATUS EXAM:  Fully alert, disheveled male cooperative.  Good eye  contact.  Speech is clear, normal pace and tone.  Patient feels tired.  Patient also appears tired and flat.  Thought processes are coherent with no  evidence of psychosis.  Cognition is intact.  Memory is fair.  Judgment and  insight are fair.  Rule out poor impulse control.   DIAGNOSES:   AXIS I:  1.  Bipolar disorder.  2.  Alcohol abuse, rule out dependence.   AXIS II:  Deferred.   AXIS III:  None.   AXIS IV:  Other psychosocial problems related to chronic alcohol use,  possible problems with primary support group.   AXIS V:  Current is 36, past year 31.   PLAN:  Admission for suicidal thoughts, alcohol use.  We will detox the  patient safely, work on relapse prevention.  Patient is to increase coping  skills by attending groups.  Medication compliance was reinforced as that  may have contributed to relapsing.  Case manager is to look at any rehab  programs available to patient.  Patient should follow up with support from  [facility].  Tentative length of stay 4-5 days.      JO/MEDQ  D:  03/06/2005  T:  03/06/2005  Job:  [id number]

## 2011-05-15 NOTE — Discharge Summary (Signed)
NAME:  Mathew Gonzalez, Mathew Gonzalez                          ACCOUNT NO.:  [id number]   MEDICAL RECORD NO.:  [id number]                   PATIENT TYPE:  OBV   LOCATION:  4710                                 FACILITY:  [facility]   PHYSICIAN:  Leighton Roach McDiarmid, M.D.             DATE OF BIRTH:  08-24-1966   DATE OF ADMISSION:  01/24/2003  DATE OF DISCHARGE:  01/25/2003                                 DISCHARGE SUMMARY   DISCHARGE MEDICATIONS:  1. Metoprolol 50 mg one p.o. b.i.d.  2. HCTZ 12.5 mg one p.o. daily.  3. Prevacid 30 mg one p.o. b.i.d. for two weeks.   DISCHARGE DIAGNOSES:  1. Chest pain rule out myocardial infarction.  2. Epigastric pain.  3. Hypertension.  4. Anxiety.   DISCHARGE INSTRUCTIONS:  The patient may take Tylenol as needed for pain 650  mg q.4h.  Right upper quadrant ultrasound scheduled for January 26, 2003, at  [facility] at 8:45 outpatient admitting.  The patient is to be NPO after  midnight tonight.  The patient is to make follow up appointment at [facility] to be seen in the next two weeks.   HISTORY OF PRESENT ILLNESS:  Mathew Gonzalez is a 45 year old white male with past  medical history significant for hypertension.  He used to be on  hydrochlorothiazide and stopped taking it several months ago.  He presented  with epigastric pain that radiated to his left shoulder and started after  eating lunch on the day of admission.  He described his pain as a 10/10  reduced to a 5/10 after nitroglycerin x1 in the emergency department.  He  described an associated weakness in his legs and paresthesias in his upper  extremities.  The epigastric pain was not associated with physical labor.  It was, however, associated with diaphoresis and dyspnea.  He has had  several episodes of gastroesophageal reflux in the past and has taken over  the counter medications for this.  He had no associated nausea or vomiting,  and denied eating any spicy foods.  His pain did not change with  position or  deep inspiration, and did not change with belching.  The patient describes  responding well to hydrochlorothiazide for blood pressure in the past as he  had been followed closely at __________ and [facility].   REVIEW OF SYSTEMS:  GENERAL:  Significant for a 40 pound weight loss over  the last year as the patient has been trying to lose weight by diet.  Denied  any fevers or chills or night sweats.  CARDIOVASCULAR:  He was complaining  of palpitations and chest pain on admission with left shoulder soreness.  LUNGS:  He was complaining of shortness of breath and tachypnea on  presentation.  GI:  No nausea and vomiting, diarrhea, no bloating or  belching.  Positive for history of GERD.  MUSCULOSKELETAL:  Denied any  recent heavy lifting.  Positive for left muscle soreness in his shoulder.   PAST MEDICAL HISTORY:  Only significant for hypertension.   CURRENT MEDICATIONS:  The only medications he had been taking was over the  counter Pepcid and ibuprofen.   HOSPITAL COURSE:  Problem 1.  Chest pain, rule out myocardial infarction.  The patient received nitroglycerin in the emergency department as well as  aspirin and oxygen.  He received a GI Cocktail after pain remained at a  5/10, and quickly decreased to 1/10.  An EKG was done on presentation which  showed sinus tachycardia without any ischemic changes.  Chest x-ray done in  the emergency department showed no active disease.  Cardiac enzymes were  obtained x3 and were negative except for slight bump in the creatinine  kinase.  A repeat EKG done on hospital day #2 showed normal sinus rhythm  with a heart rate of 58 and no associated ischemic changes.  He remained on  aspirin daily as well as O2 as needed until ruled out.  The likely cause for  his chest pain/epigastric pain was reflux, as he responded well to a GI  Cocktail.  While in the hospital, he received Pepcid b.i.d. and will be sent  home on Prevacid  b.i.d. dosage for two weeks.  If he responds well to this,  he may remain on for four to six weeks.  In order to risk stratify him for  cardiac causes, a fasting lipid panel was obtained which did not show any  elevation in his cholesterol or triglycerides.  Other diagnoses in our  differential included chest wall pain, peptic ulcer disease, esophageal  spasms, anxiety, and panic attacks.  The patient did present with flushing,  palpitations, and tachycardia which would correspond well with anxiety and  panic attacks as he was given Ativan in the emergency department and it did  help.  However, having anxiety in the setting of acute chest pain is not  enough to diagnose him with panic attacks.   Problem 2.  Hypokalemia.  His potassium on admission was 3.1.  He was orally  repleted and responded well.   Problem 3.  Hypertension.  His blood pressure on admission was 199/107.  After given nitroglycerin and Ativan, his blood pressure on recheck was  131/78 and remained stable during his stay.  He was placed on metoprolol and  will continue as an outpatient.  Because his blood pressure still remained  in the 140s/80s, HCTZ at 12.5 mg p.o. daily was added to his blood pressure  regimen.  This should be followed up as an outpatient.   Problem 4.  Sinus tachycardia.  He was given metoprolol and heart rate  rapidly improved from the 130s down to the 90s and then finally down to 58.  The patient remained asymptomatic with heart rate of 58.   Problem 5.  Elevated liver enzymes.  With an AST of 69 and ALT of 117, I  checked a hepatitis panel on him to rule out any viral hepatitis causes.  All were negative.   Problem 6.  Proteinuria.  This was likely secondary to uncontrolled  hypertension.  He had a normal renal function with BUN of 8 and creatinine  of 1.1.  May follow up as an outpatient.   Problem 7.  Rule out biliary colic as cause for his epigastric pain.  Obtain a right upper quadrant  ultrasound on January 30, follow up results as  outpatient.   FOLLOW  UP ITEMS:  1. Results of the right upper quadrant ultrasound to rule out biliary colic     as the cause of his epigastric pain.  2. He is currently on a b.i.d. dosing of Prevacid for two weeks.  If he     responds well, he remain on for longer to cover for peptic ulcer disease.     Also consider EGD in the future if he continues to have epigastric pain.  3. Close follow up on his smoking and alcohol abuse.     Lorne Skeens, D.O.                         Etta Grandchild, M.D.    Erick Alley  D:  01/26/2003  T:  01/27/2003  Job:  [id number]   cc:   [facility]

## 2011-05-15 NOTE — Discharge Summary (Signed)
Mathew Gonzalez, Mathew Gonzalez NO.:  [id number]   MEDICAL RECORD NO.:  [id number]          PATIENT TYPE:  IPS   LOCATION:  0306                          [facility]ACILITY[facility]  [facility]   PHYSICIAN:  Geoffery Lyons, M.D.      DATE OF BIRTH:  [birth year]   DATE OF ADMISSION:  01/14/2005  DATE OF DISCHARGE:  01/19/2005                                 DISCHARGE SUMMARY   CHIEF COMPLAINT AND PRESENT ILLNESS:  This was the second admission to [facility] for this 45 year old single male referred by PCP.  Called a friend, having a nervous breakdown with suicidal ideation with  thoughts of shooting.  History of alcohol use since age 65.  Longest sober  three years.  Drinking constantly for a few days, 50-60 beers, three bottles  of wine.  Stopped his medication, Lexapro, one week prior to this admission.  Thinking about taking a shotgun to himself.  Was using Klonopin and ran out  of it.   PAST PSYCHIATRIC HISTORY:  Seeing Tiajuana Amass.  Second time at  [facility].  Last time May 25th through the 28th of 2005.  Has been  on Serzone, Paxil, Wellbutrin, Zoloft, Prozac.  Stopped Lexapro due to side  effects, mainly sexual.  Had been on Antabuse in May.  Detoxed twice.   ALCOHOL/DRUG HISTORY:  As already stated.  Recurrent use of alcohol.  Last  relapse was the one that brought him to the hospital.  Drank 50-60 beers and  three bottles of wine in 4-5 days.   MEDICAL HISTORY:  Arterial hypertension.   MEDICATIONS:  Klonopin 1 mg twice a day, Equetro 200 mg twice a day, Lexapro  5 mg, decreased from 10 mg (stopped one week prior to this admission).   PHYSICAL EXAMINATION:  Performed and failed to show any acute findings.   LABORATORY DATA:  Blood chemistry within normal limits.  Liver profile  within normal limits.  TSH 2.475.   MENTAL STATUS EXAM:  Fully alert, pleasant, cooperative male.  Speech normal  rate, tempo and production.  Mood depressed, anxious.  Affect  depressed,  anxious.  Thought processes logical, coherent and relevant.  Suicidal  ruminations but can contract for safety.  No delusions.  No hallucinations.  Cognition was well-preserved.   ADMISSION DIAGNOSES:   AXIS I:  1.  Bipolar disorder, type 2.  2.  Anxiety disorder not otherwise specified.  3.  Alcohol abuse.   AXIS II:  No diagnosis.   AXIS III:  Headaches.   AXIS IV:  Moderate.   AXIS V:  Global Assessment of Functioning upon admission 28; highest Global  Assessment of Functioning in the last year 75.   HOSPITAL COURSE:  He was admitted and started in individual and group  psychotherapy.  He was maintained on Equetro.  He was detoxed with Librium.  He was given trazodone for sleep.  Started on Wellbutrin XL 150 mg in the  morning.  He was given some Risperdal 0.25 mg twice a day.  Equetro was  increased to 200 mg three times a day.  He did endorse he quit taking his  Lexapro due to sexual side effects.  Started getting more and more  depressed.  He relapsed on alcohol.  Claimed that the depression got to him.  Was willing to take Wellbutrin as he had taken it before.  The depression  was getting to the point where it was interfering with his ability to  function.  Two children that stayed with the grandmother.  Endorsed the  hallucinations.  Vague hallucinations of noises.  He was wanted to explore  the possibility of using lithium.  We went ahead and started lithium 300 mg  twice a day.  He was placed on Risperdal 0.25 mg twice a day and Equetro was  increased to 200 mg three times a day.  On January 23rd, he endorsed he was  feeling much better.  No suicidal ideation.  No homicidal ideation.  No  hallucinations.  No delusions.  Willing to work on long-term abstinence.  He  was back and working the program.  Got involved with a sponsor.  Overall was  tolerating medications well.   DISCHARGE DIAGNOSES:   AXIS I:  1.  Bipolar disorder not otherwise specified.   2.  Anxiety disorder not otherwise specified.  3.  Alcohol abuse.   AXIS II:  No diagnosis.   AXIS III:  Headaches.   AXIS IV:  Moderate.   AXIS V:  Global Assessment of Functioning upon discharge 50.   DISCHARGE MEDICATIONS:  1.  Eskalith CR 450 mg, 2 a day.  2.  Wellbutrin XL 300 mg in the morning.  3.  Protonix 40 mg per day.  4.  Claritin 10 mg daily.  5.  Risperdal 0.5 mg in the morning and 1.5 mg at night.  6.  Seroquel 200 mg at bedtime.  7.  Trazodone 100 mg at bedtime as needed for sleep.   FOLLOW UP:  Dr. Tomasa Rand.      IL/MEDQ  D:  02/18/2005  T:  02/18/2005  Job:  [id number]

## 2011-05-15 NOTE — H&P (Signed)
NAME:  Mathew Gonzalez, Mathew Gonzalez                          ACCOUNT NO.:  [id number]   MEDICAL RECORD NO.:  [id number]                   PATIENT TYPE:  IPS   LOCATION:  0505                                 [facility]ILITY[facility]  [facility]   PHYSICIAN:  Geoffery Lyons, M.D.                   DATE OF BIRTH:  13-Feb-1966   DATE OF ADMISSION:  05/21/2004  DATE OF DISCHARGE:                         PSYCHIATRIC ADMISSION ASSESSMENT   IDENTIFYING. INFORMATION:  The patient is a 45 year old single white male  voluntarily admitted on May 21, 2004.   HISTORY OF PRESENT ILLNESS:  The patient presents with a history of alcohol  abuse.  The patient states he wants help to stop drinking.  He is feeling  very depressed.  He denies any suicidal ideation at this time.  He states he  has lost over 20 pounds with decreased sleep.  He identifies no specific  stressors.  He denies any hallucinations.  He states that he feels like he  needs an antidepressant; has been on them at one point in time but had side  effects.  The patient is currently experiencing withdrawal symptoms at this  time.   PAST PSYCHIATRIC HISTORY:  This is the first hospitalization at [facility].  This is his secondary detoxification; was detoxified back in  the 1990s.  Longest history of sobriety has been three years.   SUBSTANCE ABUSE HISTORY:  The patient dips snuff.  He drink vodka, a pint or  more daily.  He does drink in the morning.  His last drink was on May 21, 2004.  He has been drinking for years.  He denies any blackouts.  No seizure  activity.  No drug use.   PAST MEDICAL HISTORY:  Primary care Naiyah Klostermann: None.  Medical problems: None.   MEDICATIONS:  None.  The patient has been on Serzone, Paxil, and Wellbutrin  in the past.   DRUG ALLERGIES:  No known allergies.   PHYSICAL EXAMINATION:  GENERAL:  The patient appears disheveled.  His face  appears somewhat red.  He has some mild upper extremity tremors.  VITAL SIGNS:  Temperature  97.7, heart rate 78, respirations 22, blood  pressure 138/88.  He is 233 pounds.  He is 6 feet tall.   LABORATORY DATA:  Labs are pending.   SOCIAL HISTORY:  This is a 45 year old single white male with two children.  He has a roommate.  He works in a [employer].  No legal problems.   FAMILY HISTORY:  He reports bipolar disorder.   MENTAL STATUS EXAM:  He is an alert, cooperative male, fair eye contact,  disheveled.  Speech is soft spoken but relevant.  Mood is depressed.  Affect  is teary-eyed at times.  Thought processes are coherent.  There appears to  be no evidence of psychosis.  Cognitive functioning: Intact.  Memory is  fair.  Judgment is fair.  Insight is  fair.   ADMISSION DIAGNOSES:   AXIS I:  1. Depressive disorder, not otherwise specified.  2. Alcohol dependence.   AXIS II:  Deferred.   AXIS III:  None.   AXIS IV:  Other psychosocial problems related to alcohol use.   AXIS V:  Current is 35, estimated this past year is 60.   INITIAL PLAN OF CARE:  Plan is a voluntary admission to [facility] for depression and alcohol abuse.  Contract for safety.  Will  stabilize and detoxify with Librium.  Will encourage fluids.  Will work on  relapse prevention.  The patient is to attend groups.  The patient is to  follow up with AA and remain alcohol free.  Will initiate an antidepressant  or discuss use with risks and benefits along with medication compliance when  is physically feeling better.   ESTIMATED LENGTH OF STAY:  Three to five days.     Landry Corporal, N.P.                       Geoffery Lyons, M.D.    JO/MEDQ  D:  05/21/2004  T:  05/21/2004  Job:  [id number]

## 2011-05-15 NOTE — H&P (Signed)
NAME:  Mathew Gonzalez, Mathew Gonzalez                          ACCOUNT NO.:  [id number]   MEDICAL RECORD NO.:  [id number]                   PATIENT TYPE:  INP   LOCATION:  1823                                 FACILITY:  [facility]   PHYSICIAN:  Leighton Roach McDiarmid, M.D.             DATE OF BIRTH:  May 24, 1966   DATE OF ADMISSION:  01/24/2003  DATE OF DISCHARGE:                                HISTORY & PHYSICAL   PRIMARY CARE PHYSICIAN:  [facility]   CHIEF COMPLAINT:  Chest pain.   HISTORY OF PRESENT ILLNESS:  This is a 45 year old white male with a past  medical history significant for hypertension who used to be on  hydrochlorothiazide and stopped taking it several months ago.  He presented  with epigastric pain that radiated to his left shoulder.  The pain started  after eating lunch today.  The pain was constant in nature, rated at a 10/10  for several hours, finally relieved to a 5/10 after nitroglycerin x1 in the  emergency department.  His chest pain was associated with weakness in his  legs and paresthesias in the upper extremities, which he says were new.  His  chest pain occurred while at work today, and he explained that he was not  doing any physical labor.  It was associated with diaphoresis and trouble  breathing.  He has had several episodes of epigastric pain in the past and  one ER visit for heartburn.  He describes taking Pepcid and Prilosec OTC in  the past, although only occasionally uses them, and says that they do help  his heartburn.  He has no associated nausea or vomiting.  The chest pain  does not change with position or deep inspiration.  He denies eating any  spicy foods, and denies any change in the chest pain with belching.  He  reported that he discontinued his hydrochlorothiazide because he lost weight  and his blood pressure improved during his last two visits to [facility] several months ago.   REVIEW OF SYSTEMS:  CONSTITUTIONAL:  No fevers  or chills.  A 40 pound weight  loss over the last one year, trying to lose with diet.  CARDIOVASCULAR:  Positive for palpitations, chest pain.  LUNGS:  Denied coughing, URI  symptoms.  Positive for shortness of breath and tachypnea on presentation.  SKIN:  Flush, on presentation, in the face.  GASTROINTESTINAL:  Denies  nausea, vomiting, or diarrhea.  Denies bloating or belching.  Positive for  history of GERD.  GENITOURINARY:  Denies dysuria or hematuria.  ENDOCRINE:  Denies any history of diabetes.  MUSCULOSKELETAL:  Denies any recent heavy  lifting.  Left shoulder slightly sore.   PAST MEDICAL HISTORY:  Hypertension.  Was taking hydrochlorothiazide,  questionable from 2001 to 2003.   PAST SURGICAL HISTORY:  Right rotator cuff repair.   MEDICATIONS:  1. Ibuprofen p.r.n.  2. Multivitamin daily.  3. Pepcid.  4. Prilosec OTC p.r.n., none recently.   ALLERGIES:  No known drug allergies.   SOCIAL HISTORY:  Alcohol use 1-2 pints on the weekends.  Lives in [place]  with a roommate.  His mother is in [place], and he has an uncle in the  ICU currently.  Denies any illicit drug use.  History of smoking about one  pack year per patient.  Positive for snuff and chewing tobacco currently.   FAMILY HISTORY:  Mother living, in good health.  Father living, in good  health.  Maternal grandfather in his 67s with a pacemaker.  Denies any  family history of MI, hypertension, or diabetes.   PHYSICAL EXAMINATION:  VITAL SIGNS:  Temperature 96.8, pulse 131 (down to  99), respirations 22 (down to 20), blood pressure 199/107 (down to 131/78  status post nitroglycerin x1), O2 saturation 100% on 2 L.  GENERAL:  Alert and oriented x3, apparently in no acute distress.  Appears  older than stated age.  Smells like cigarette smoke.  CVS:  Regular rate and rhythm with murmurs.  No rubs.  Pulses 2+  bilaterally.  No carotid bruits.  LUNGS:  Clear to auscultation bilaterally without wheezing, rales,  or  rhonchi.  No increased work of breathing.  No tachypnea.  ABDOMEN:  Soft, nontender, nondistended.  Positive bowel sounds.  No  hepatosplenomegaly.  No rebound, guarding, or rigidity.  HEENT:  Head normocephalic and atraumatic.  Pupils equal, round and reactive  to light and accommodation.  Extraocular muscles are intact bilaterally.  Tympanic membranes translucent and gray.  Nares patent.  Nasal cannula in  place.  Oropharynx injected without exudate.  Moist mucous membranes.  NEUROLOGICAL:  Sensation intact in upper and lower extremities.  Cranial  nerves II-XII are grossly intact bilaterally.  Gait not examined.  NECK:  No JVD, supple.  No lymphadenopathy.  EXTREMITIES:  No edema.  There is fingernail clubbing and hand calloses  bilaterally.  No skin lacerations.  MUSCULOSKELETAL:  Grip strength +5/5 bilaterally.  Lower extremity strength  +5/5.  SKIN:  No rashes or skin lacerations.  No pallor or facial flushing  currently.   LABORATORY DATA:  BMET and CMET obtained in the ED showed sodium 131,  potassium 3.1, chloride 95, bicarb 29, BUN 8, creatinine 1.1, glucose 133.  Total protein 7.9, albumin 4.1, AST 69, ALT 117, alkaline phosphatase 74,  bilirubin 1.0.  CBC shows white count 8.4, hemoglobin 17.9, hematocrit 53.4,  platelet count 220, absolute neutrophil count 5.8, 59% neutrophils, and 22%  lymphocytes.  His first set of cardiac enzymes shows a CK elevated at 300,  MB 3.1, Troponin 0.04.  His prothrombin time was 12.7, INR 0.9, and his PTT  28.  Urinalysis shows specific gravity 1.029, pH 7.5, glucose negative,  ketones 15, total protein greater than 300, negative for nitrites, and small  leukocyte esterase, 3-6 white blood cells, 0-2 red cells, and rare bacteria.  His urine drug screen was negative.   ASSESSMENT/PLAN:  This is a 45 year old white male with chest pain; rule out  myocardial infarction, likely anxiety versus peptic ulcer disease versus gastroesophageal  reflux disease.   1. Chest pain.  Aspirin, oxygen, nitroglycerin p.r.n., and GI cocktail all     done in the emergency department.  EKG shows some tachycardia without     ischemic changes.  Will repeat in the morning.  Chest x-ray shows no     active disease, and his first set of cardiac enzymes are  negative x1.     a. Rule out myocardial infarction.  Will check three sets of cardiac        enzymes q.8 h.  Sets two and three are currently pending.  Will        prescribe sublingual nitroglycerin p.r.n., 325 mg of daily aspirin,        and O2 as needed to keep O2 saturations greater than 92%.  Will hold        off on heparin and cardiology consult at this time, since his first        set of cardiac enzymes is negative, and an EKG was within normal        limits.     b. Questionable gastroesophageal reflux disease.  Likely with his history        of heartburn and the location of pain in his epigastrium.  Per his        history including length of pain and quality of his pain, consistent        with gastroesophageal reflux disease.  Will try a GI cocktail in the        emergency department now, and put on scheduled Pepcid.  Cannot rule        out peptic ulcer disease at this time.  Will likely need to be        discontinued on a daily H2 blocker or PPI.     c. Rule out chest wall pain.  If pain is relieved solely by nonsteroidal        anti-inflammatory drugs and reproducible, which it currently is not,        or changes with position, then will consider as etiology.     d. Questionable anxiety versus panic attack.  The patient presented with        flushing, palpitations, tachycardia, and hypertension, which fit the        picture for anxiety and panic attack, although the patient does deny        these feelings.  He responded well to Ativan with blood pressure and        pulse reduction.  2. Hypokalemia.  Will treat orally with K-Dur 40 mEq x1 now, and 20 mEq     daily.  Will recheck BMET  in the morning.  3. Hypertension.  Will place on metoprolol 50 mg p.o. b.i.d.  His urine drug     screen is negative, so it is safe to start a Beta-Blocker.  Will consider     hydrochlorothiazide as an outpatient, since he has been on good control     with it in the past.  4. Tachycardia, sinus.  Beta-Blocker as above.  Will place on telemetry     monitor.  5. Elevated liver enzymes with an AST and ALT elevation.  We will check a     DVT and a hepatitis panel.  Would expect the AST to be greater than the     ALT if alcohol was his etiology.  6. Proteinuria likely secondary to his uncontrolled hypertension.  Renal     function is normal with a BUN of 8, and creatinine of 1.1.  Will continue     to check renal function with a CMET tomorrow morning, and additional     urinalysis as needed.     Avon Gully, D.O.  Etta Grandchild, M.D.    Chrystine Oiler  D:  01/24/2003  T:  01/24/2003  Job:  [id number]  cc:   [facility]

## 2011-10-02 LAB — RAPID STREP SCREEN (MED CTR MEBANE ONLY): Streptococcus, Group A Screen (Direct): NEGATIVE

## 2011-10-12 ENCOUNTER — Emergency Department (HOSPITAL_COMMUNITY)
Admission: EM | Admit: 2011-10-12 | Discharge: 2011-10-12 | Disposition: A | Discharge status: Home / Self Care | Payer: Self-pay | Attending: Emergency Medicine | Admitting: Emergency Medicine

## 2011-10-12 DIAGNOSIS — M26609 Unspecified temporomandibular joint disorder, unspecified side: Secondary | ICD-10-CM | POA: Insufficient documentation

## 2011-10-12 DIAGNOSIS — H9209 Otalgia, unspecified ear: Secondary | ICD-10-CM | POA: Insufficient documentation

## 2011-10-12 DIAGNOSIS — F319 Bipolar disorder, unspecified: Secondary | ICD-10-CM | POA: Insufficient documentation

## 2011-10-12 DIAGNOSIS — R6884 Jaw pain: Secondary | ICD-10-CM | POA: Insufficient documentation

## 2012-01-06 ENCOUNTER — Encounter (HOSPITAL_COMMUNITY): Payer: Self-pay | Admitting: Psychiatry

## 2012-01-06 ENCOUNTER — Ambulatory Visit (INDEPENDENT_AMBULATORY_CARE_PROVIDER_SITE_OTHER): Payer: Self-pay | Admitting: Psychiatry

## 2012-01-06 VITALS — BP 129/87 | HR 66 | Ht 73.0 in | Wt 273.2 lb

## 2012-01-06 DIAGNOSIS — F319 Bipolar disorder, unspecified: Secondary | ICD-10-CM

## 2012-01-06 MED ORDER — LAMOTRIGINE 25 MG PO TABS: ORAL_TABLET | ORAL | Status: DC

## 2012-01-06 NOTE — Progress Notes (Signed)
Chief complaint "I cannot sleep and I need medication"  History of presenting illness  Patient is 46 year old Caucasian male who know him to this office for his past psychiatric illness came for the appointment. He was last seen in February 2012. Apparently he lost his job and then ran out of his medication recently he again started a job and felt he needs to go back on his medication. He was taking Lamictal along with Ambien which was helping him until he ran out in April of last year. Patient admitted he had episodes of agitation anger or insomnia and also admitted using marijuana to calm him to. Now he has insurance and like to reestablish his care in this office. He wants to go back on his Lamictal which had helped in the past. Recently he admitted to having anxiety and anger episodes but denies any violent her severe agitation. He has any hallucination or any active or passive suicidal thoughts. In the past he has no side effects or concern about the Lamictal.  Past psychiatric history Patient has multiple psychiatric inpatient treatment due to severe bipolar illness and alcohol relapse. Patient has history of suicidal thinking but no attempt. In the past he had tried Risperdal Depakote lithium Wellbutrin Paxil Prozac and Neurontin. He has history of significant anger and hallucination When he is not on medication.  Family history Patient father grandparents brother and mother has history of bipolar disorder.   Medical history Patient has history of knee surgery, headache, allergic rhinitis, rhinoplasty, rotator cuff repair and history of hypertension. He also has history of nephrolithiasis.  Alcohol and substance abuse history Patient has significant history of alcohol use and marijuana use. His last use of marijuana was in summer when he was using it for sleep.  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 He was born and raised in [place].  Mental status examination patient is casually dressed and fairly cooperative. He has gained weight from his last visit. His his speech is slow and his thought processes also slowed but limited conversation. He has a time some thought blocking but he denies any active or passive suicidal thoughts or homicidal thoughts. There no psychotic symptoms present at this time. He described his mood is tired and his affect is mood congruent. His alert and oriented x3. His insight judgment and pulse control is okay.  Assessment Axis I bipolar disorder, history of alcohol and marijuana use Axis II deferred Axis III see medical history Axis IV mild to moderate Axis V 55-65  Plan Since patient is off from his medication for almost 10 months we will restart his Lamictal and the smaller dosage. I will start Lamictal 25 mg and gradually increased to 50 mg in one week. I explained risks and benefits of medication including rash with Lamictal and in that case he need to stop the medication. At this time I will not prescribe any Ambien her sedatives. I did encourage him to involved in exercise and watch his diet to control his weight. I recommended to call us if he is any question or concern about the medication. We also talked about safety plan that in case he started having any suicidal thoughts and homicidal thoughts of worsening of her symptoms and he to call 911 or go to local ER. I will see him again in 3 weeks. Time spent 25 minutes

## 2012-01-27 ENCOUNTER — Encounter (HOSPITAL_COMMUNITY): Payer: Self-pay | Admitting: Psychiatry

## 2012-01-27 ENCOUNTER — Ambulatory Visit (INDEPENDENT_AMBULATORY_CARE_PROVIDER_SITE_OTHER): Payer: BC Managed Care – PPO | Admitting: Psychiatry

## 2012-01-27 DIAGNOSIS — F319 Bipolar disorder, unspecified: Secondary | ICD-10-CM

## 2012-01-27 MED ORDER — ZOLPIDEM TARTRATE 10 MG PO TABS: 10.0000 mg | ORAL_TABLET | Freq: Every evening | ORAL | Status: AC | PRN

## 2012-01-27 MED ORDER — LAMOTRIGINE 100 MG PO TABS: 100.0000 mg | ORAL_TABLET | Freq: Every day | ORAL | Status: DC

## 2012-01-27 NOTE — Progress Notes (Signed)
Patient came for his followup appointment. He has started taking Lamictal 25 mg daily. He will start taking 50 mg from tomorrow. So far he reported no side effects including any itching rash. He continues to have poor sleep and racing thoughts in the night. He admitted that he continues to have difficulty leaving his house. Though he joined gym membership but he has not started regularly. He continues to have episodic agitation and anger but denies any aggressive or violent episode he denies any hallucination or paranoid thinking. He is not using any drugs or drinking alcohol. He is wondering if she can take some Ambien until Lamictal starts working..  Past psychiatric history Patient has multiple psychiatric inpatient treatment due to severe bipolar illness and alcohol relapse. Patient has history of suicidal thinking but no attempt. In the past he had tried Risperdal Depakote lithium Wellbutrin Paxil Prozac and Neurontin. He has history of significant anger and hallucination When he is not on medication.  Family history Patient father grandparents brother and mother has history of bipolar disorder.   Medical history Patient has history of knee surgery, headache, allergic rhinitis, rhinoplasty, rotator cuff repair and history of hypertension. He also has history of nephrolithiasis.  Alcohol and substance abuse history Patient has significant history of alcohol use and marijuana use. His last use of marijuana was in summer when he was using it for sleep.  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 He was born and raised in [place].  Mental status examination patient is casually dressed and fairly cooperative. He has gained weight from his last visit. His his speech is slow and his thought processes also slowed but limited conversation. He has a time some thought blocking but he denies any active  or passive suicidal thoughts or homicidal thoughts. There no psychotic symptoms present at this time. He described his mood is tired and his affect is mood congruent. His alert and oriented x3. His insight judgment and pulse control is okay.  Assessment Axis I bipolar disorder, history of alcohol and marijuana use Axis II deferred Axis III see medical history Axis IV mild to moderate Axis V 55-65  Plan  patient will continue to take Lamictal 50 mg and once he finish he will gradually increase to 100 mg daily. Patient has tried Lamictal in the past on 150 mg without any side effects and with good response. I will give him Ambien 10 mg only 15 tablet to target his insomnia and until Lamictal starts working. I did encourage him to be more involved in exercise and watch his diet. I recommended to call us if he is any question or concern about the medication. We also talked about safety plan that in case he started having any suicidal thoughts and homicidal thoughts of worsening of her symptoms and he to call 911 or go to local ER. I will see him again in 4 weeks.

## 2012-02-24 ENCOUNTER — Ambulatory Visit (HOSPITAL_COMMUNITY): Payer: BC Managed Care – PPO | Admitting: Psychiatry

## 2012-03-04 ENCOUNTER — Encounter (HOSPITAL_COMMUNITY): Payer: Self-pay

## 2012-03-04 ENCOUNTER — Ambulatory Visit (HOSPITAL_COMMUNITY): Payer: BC Managed Care – PPO | Admitting: Psychiatry

## 2012-05-24 ENCOUNTER — Emergency Department (HOSPITAL_COMMUNITY)
Admission: EM | Admit: 2012-05-24 | Discharge: 2012-05-25 | Disposition: A | Discharge status: Home / Self Care | Payer: BC Managed Care – PPO | Attending: Emergency Medicine | Admitting: Emergency Medicine

## 2012-05-24 ENCOUNTER — Encounter (HOSPITAL_COMMUNITY): Payer: Self-pay

## 2012-05-24 DIAGNOSIS — I10 Essential (primary) hypertension: Secondary | ICD-10-CM | POA: Insufficient documentation

## 2012-05-24 DIAGNOSIS — T50905A Adverse effect of unspecified drugs, medicaments and biological substances, initial encounter: Secondary | ICD-10-CM

## 2012-05-24 DIAGNOSIS — F411 Generalized anxiety disorder: Secondary | ICD-10-CM | POA: Insufficient documentation

## 2012-05-24 DIAGNOSIS — R110 Nausea: Secondary | ICD-10-CM | POA: Insufficient documentation

## 2012-05-24 DIAGNOSIS — T887XXA Unspecified adverse effect of drug or medicament, initial encounter: Secondary | ICD-10-CM | POA: Insufficient documentation

## 2012-05-24 NOTE — ED Notes (Signed)
Pt complains of being nauseated and short of breath after smoking marijuana and drinking a little bit

## 2012-05-24 NOTE — ED Notes (Signed)
Per EMS, pt states about 2 hours ago his throat began swelling and became painful.  He had been drinking wine and smoking marijuana.  Pt became nauseated and vomited x 1.  BP  102/68, hr 88.  Pt ambulated to lobby.  No problems talking, speaking, managing airway.

## 2012-05-25 LAB — POCT I-STAT, CHEM 8
Calcium, Ion: 1.17 mmol/L (ref 1.12–1.32)
Glucose, Bld: 121 mg/dL — ABNORMAL HIGH (ref 70–99)
HCT: 46 % (ref 39.0–52.0)
Hemoglobin: 15.6 g/dL (ref 13.0–17.0)
TCO2: 23 mmol/L (ref 0–100)

## 2012-05-25 MED ORDER — ONDANSETRON 8 MG PO TBDP
8.0000 mg | ORAL_TABLET | Freq: Once | ORAL | Status: AC
  Administered 2012-05-25: 8 mg via ORAL
  Filled 2012-05-25: qty 1

## 2012-05-25 MED ORDER — SODIUM CHLORIDE 0.9 % IV BOLUS (SEPSIS)
1000.0000 mL | Freq: Once | INTRAVENOUS | Status: AC
  Administered 2012-05-25: 1000 mL via INTRAVENOUS

## 2012-05-25 MED ORDER — ONDANSETRON HCL 4 MG PO TABS: 4.0000 mg | ORAL_TABLET | Freq: Four times a day (QID) | ORAL | Status: AC

## 2012-05-25 NOTE — ED Notes (Signed)
Patient ambulated to BR no assistance was required.

## 2012-05-25 NOTE — ED Provider Notes (Signed)
History     CSN: [id number]  Arrival date & time 05/24/12  2241   First MD Initiated Contact with Patient 05/25/12 0020      Chief Complaint  Patient presents with  . Alcohol Intoxication  . Addiction Problem    (Consider location/radiation/quality/duration/timing/severity/associated sxs/prior treatment) HPI History provided by patient. Admits to drinking alcohol and smoking marijuana tonight. After smoking marijuana became very anxious and nauseated and felt like he is having reaction to something. At the time of my evaluation his only complaint is nausea. He did have some sensation of throat swelling and pain but that is now resolved. No rash. No chest pain or shortness of breath. No wheezing. No history of allergic reaction to marijuana. He was with other people using the same drugs he did not have similar reactions. No history of same. Mild to moderate severity. Patient admits he does feel high. No vomiting. No difficulty with speech angulation. History reviewed. No pertinent past medical history.  History reviewed. No pertinent past surgical history.  History reviewed. No pertinent family history.  History  Substance Use Topics  . Smoking status: Never Smoker   . Smokeless tobacco: Not on file  . Alcohol Use: Yes      Review of Systems  Constitutional: Negative for fever and chills.  HENT: Negative for neck pain and neck stiffness.   Eyes: Negative for pain.  Respiratory: Negative for shortness of breath.   Cardiovascular: Negative for chest pain.  Gastrointestinal: Positive for nausea. Negative for abdominal pain.  Genitourinary: Negative for dysuria.  Musculoskeletal: Negative for back pain.  Skin: Negative for rash.  Neurological: Negative for headaches.  All other systems reviewed and are negative.    Allergies  Prednisone  Home Medications   Current Outpatient Rx  Name Route Sig Dispense Refill  . DOXYCYCLINE HYCLATE 100 MG PO TABS Oral Take 100 mg by  mouth 2 (two) times daily.      BP 164/60  Pulse 73  Temp(Src) 98.8 F (37.1 C) (Oral)  Resp 18  Wt 270 lb (122.471 kg)  SpO2 95%  Physical Exam  Constitutional: He is oriented to person, place, and time. He appears well-developed and well-nourished.  HENT:  Head: Normocephalic and atraumatic.  Eyes: Conjunctivae and EOM are normal. Pupils are equal, round, and reactive to light.  Neck: Trachea normal. Neck supple. No thyromegaly present.  Cardiovascular: Normal rate, regular rhythm, S1 normal, S2 normal and normal pulses.     No systolic murmur is present   No diastolic murmur is present  Pulses:      Radial pulses are 2+ on the right side, and 2+ on the left side.  Pulmonary/Chest: Effort normal and breath sounds normal. He has no wheezes. He has no rhonchi. He has no rales. He exhibits no tenderness.  Abdominal: Soft. Normal appearance and bowel sounds are normal. There is no tenderness. There is no CVA tenderness and negative Murphy's sign.  Musculoskeletal:       BLE:s Calves nontender, no cords or erythema, negative Homans sign  Neurological: He is alert and oriented to person, place, and time. He has normal strength. No cranial nerve deficit or sensory deficit. GCS eye subscore is 4. GCS verbal subscore is 5. GCS motor subscore is 6.  Skin: Skin is warm and dry. No rash noted. He is not diaphoretic.  Psychiatric: His speech is normal.       Cooperative and appropriate. Mild anxiety    ED Course  Procedures (including critical  care time)  Labs Reviewed  POCT I-STAT, CHEM 8 - Abnormal; Notable for the following:    Glucose, Bld 121 (*)    All other components within normal limits   IV fluids and Zofran. Serial evaluations with condition improving. Patient ambulates to the bathroom no acute distress and feels comfortable to be discharged home.  MDM   Nausea and anxiety after smoking marijuana, in addition to drinking alcohol tonight. Symptoms likely represent adverse  reaction to drug use. No other medications. No wheezes. No stridor. No rash. Doubt allergic reaction. No respiratory distress with normal lung sounds and no hypoxia. Doubt pulmonary process. Patient ambulates comfortably and nausea resolved. Stable for discharge home.        Sunnie Nielsen, MD 05/25/12 [phone]

## 2012-05-25 NOTE — Discharge Instructions (Signed)
Drug Reaction The upset stomach you are having may be from the medications you are taking. Often an upset stomach from medication comes from irritation of the stomach. This is often not a true allergic reaction.  CAUSES  Some medications can cause reactions. This may be a mild rash, nausea and vomiting, or a life- threatening reaction called anaphylaxis. Anaphylaxis involves the whole body.  HOME CARE INSTRUCTIONS  If the only problem you are having is an upset stomach, taking the medication with some food or something in your stomach may make the problem go away. If this remedy does not work, you should contact your caregiver. It may be necessary to switch medication.  This will not work if the medication must be taken on an empty stomach.  SEEK MEDICAL CARE IF:  You develop other problems that are getting worse rather than better.  You develop new symptoms.  There is a return of the symptoms that brought you to your caregiver or to the emergency department.  SEEK IMMEDIATE MEDICAL CARE IF:  You have difficulty breathing, are wheezing, or you have a tight feeling in your chest or throat.  You have a swollen mouth, or you have hives, swelling, or itching all over your body. THIS IS AN EMERGENCY - CALL 911.  You develop severe vomiting or diarrhea.  You feel faint or pass out. THIS IS AN EMERGENCY! FAMILY OR CAREGIVER SHOULD CALL 911.  MAKE SURE YOU:  Understand these instructions.  Will watch your condition.  Will get help right away if you are not doing well or get worse.

## 2013-06-02 ENCOUNTER — Emergency Department (HOSPITAL_COMMUNITY)
Admission: EM | Admit: 2013-06-02 | Discharge: 2013-06-02 | Disposition: A | Discharge status: Home / Self Care | Payer: BC Managed Care – PPO | Attending: Emergency Medicine | Admitting: Emergency Medicine

## 2013-06-02 ENCOUNTER — Emergency Department (HOSPITAL_COMMUNITY): Payer: BC Managed Care – PPO

## 2013-06-02 ENCOUNTER — Encounter (HOSPITAL_COMMUNITY): Payer: Self-pay | Admitting: Emergency Medicine

## 2013-06-02 DIAGNOSIS — Z791 Long term (current) use of non-steroidal anti-inflammatories (NSAID): Secondary | ICD-10-CM | POA: Insufficient documentation

## 2013-06-02 DIAGNOSIS — Z8739 Personal history of other diseases of the musculoskeletal system and connective tissue: Secondary | ICD-10-CM | POA: Insufficient documentation

## 2013-06-02 DIAGNOSIS — Z79899 Other long term (current) drug therapy: Secondary | ICD-10-CM | POA: Insufficient documentation

## 2013-06-02 DIAGNOSIS — R0789 Other chest pain: Secondary | ICD-10-CM | POA: Insufficient documentation

## 2013-06-02 DIAGNOSIS — R079 Chest pain, unspecified: Secondary | ICD-10-CM

## 2013-06-02 DIAGNOSIS — I10 Essential (primary) hypertension: Secondary | ICD-10-CM | POA: Insufficient documentation

## 2013-06-02 HISTORY — DX: Essential (primary) hypertension: I10

## 2013-06-02 HISTORY — DX: Unspecified osteoarthritis, unspecified site: M19.90

## 2013-06-02 LAB — BASIC METABOLIC PANEL
BUN: 24 mg/dL — ABNORMAL HIGH (ref 6–23)
CO2: 29 mEq/L (ref 19–32)
Chloride: 102 mEq/L (ref 96–112)
Glucose, Bld: 89 mg/dL (ref 70–99)
Potassium: 3.9 mEq/L (ref 3.5–5.1)
Sodium: 141 mEq/L (ref 135–145)

## 2013-06-02 LAB — CBC
HCT: 44.2 % (ref 39.0–52.0)
Hemoglobin: 15 g/dL (ref 13.0–17.0)
MCH: 30.1 pg (ref 26.0–34.0)
MCHC: 33.9 g/dL (ref 30.0–36.0)
MCV: 88.6 fL (ref 78.0–100.0)
RBC: 4.99 MIL/uL (ref 4.22–5.81)

## 2013-06-02 LAB — POCT I-STAT, CHEM 8
BUN: 23 mg/dL (ref 6–23)
Creatinine, Ser: 1 mg/dL (ref 0.50–1.35)
Glucose, Bld: 90 mg/dL (ref 70–99)
Hemoglobin: 15.3 g/dL (ref 13.0–17.0)
TCO2: 30 mmol/L (ref 0–100)

## 2013-06-02 MED ORDER — NAPROXEN 500 MG PO TABS: 500.0000 mg | ORAL_TABLET | Freq: Two times a day (BID) | ORAL | Status: DC

## 2013-06-02 NOTE — ED Notes (Addendum)
Pt states he has had chest pain for three hours. Pt states he had lower back pain yesterday morning and is still here a little bit. Pt denies n/v. Pt denies any cardiac hx except for high BP ten years ago when he was on testoterone. 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

## 2013-06-02 NOTE — ED Provider Notes (Signed)
History     CSN: [id number]  Arrival date & time 06/02/13  1342   First MD Initiated Contact with Patient 06/02/13 1412      Chief Complaint  Patient presents with  . Chest Pain    (Consider location/radiation/quality/duration/timing/severity/associated sxs/prior treatment) HPI Comments: 47 year old male with a past medical history of arthritis who takes no medications, denies diabetes, tobacco use, alcohol use, cocaine use. He presents with a complaint of left upper chest pain which is sharp and stabbing in nature, occurred at approximately 9:00 this morning, he is unaware of any specific activities that brought this on.  He denies any shortness of breath, no radiation of the pain, no nausea vomiting or diaphoresis. He denies any swelling of the legs, trauma, travel, immobilization, swelling. He has no other risk factors or history of having coronary disease, has never had a stress test but does state that he has had an EKG and his family Dr. in the past. He feels like his pain is overall intermittent and worse with using the left side of his chest including certain positions, certain arm movements. No coughing, no fevers  The history is provided by the patient.    Past Medical History  Diagnosis Date  . Hypertension   . Arthritis     osteoarthritis     Past Surgical History  Procedure Laterality Date  . Replacement total knee    . Rotator cuff repair      No family history on file.  History  Substance Use Topics  . Smoking status: Never Smoker   . Smokeless tobacco: Not on file  . Alcohol Use: Yes      Review of Systems  All other systems reviewed and are negative.    Allergies  Prednisone  Home Medications   Current Outpatient Rx  Name  Route  Sig  Dispense  Refill  . cetirizine (ZYRTEC) 10 MG tablet   Oral   Take 10 mg by mouth daily.         . naproxen (NAPROSYN) 500 MG tablet   Oral   Take 1 tablet (500 mg total) by mouth 2 (two) times daily with a  meal.   30 tablet   0     BP 145/100  Pulse 73  Temp(Src) 97.9 F (36.6 C) (Oral)  Resp 20  SpO2 98%  Physical Exam  Nursing note and vitals reviewed. Constitutional: He appears well-developed and well-nourished. No distress.  HENT:  Head: Normocephalic and atraumatic.  Mouth/Throat: Oropharynx is clear and moist. No oropharyngeal exudate.  Eyes: Conjunctivae and EOM are normal. Pupils are equal, round, and reactive to light. Right eye exhibits no discharge. Left eye exhibits no discharge. No scleral icterus.  Neck: Normal range of motion. Neck supple. No JVD present. No thyromegaly present.  Cardiovascular: Normal rate, regular rhythm, normal heart sounds and intact distal pulses.  Exam reveals no gallop and no friction rub.   No murmur heard. Pulmonary/Chest: Effort normal and breath sounds normal. No respiratory distress. He has no wheezes. He has no rales. He exhibits tenderness ( Reproducible tenderness to chest palpation over the left upper pectoralis. The patient states this reproduces his pain exactly.).  Abdominal: Soft. Bowel sounds are normal. He exhibits no distension and no mass. There is no tenderness.  Musculoskeletal: Normal range of motion. He exhibits no edema and no tenderness.  Lymphadenopathy:    He has no cervical adenopathy.  Neurological: He is alert. Coordination normal.  Skin: Skin is warm and  dry. No rash noted. No erythema.  Psychiatric: He has a normal mood and affect. His behavior is normal.    ED Course  Procedures (including critical care time)  Labs Reviewed  BASIC METABOLIC PANEL - Abnormal; Notable for the following:    BUN 24 (*)    All other components within normal limits  CBC  POCT I-STAT, CHEM 8  POCT I-STAT TROPONIN I   Dg Chest 2 View  06/02/2013   *RADIOLOGY REPORT*  Clinical Data: Chest pain, back pain  CHEST - 2 VIEW  Comparison: None.  Findings: Cardiomediastinal silhouette is unremarkable.  No acute infiltrate or pleural  effusion.  No pulmonary edema.  Degenerative changes distal right clavicle.  IMPRESSION: No active disease.   Original Report Authenticated By: Natasha Mead, M.D.     1. Chest pain       MDM  ED ECG REPORT  I personally interpreted this EKG   Date: 06/02/2013   Rate: 70  Rhythm: normal sinus rhythm  QRS Axis: normal  Intervals: normal  ST/T Wave abnormalities: normal  Conduction Disutrbances:none  Narrative Interpretation:   Old EKG Reviewed: none available   Pt presents with CP, is very low risk for ACS, pain is atypical, ECG is normal.  It is also reproducible to palpation and with ROM of the L arm and certain positions that require use of the pectoralis muscles.  The patient is very well-appearing, chest x-ray is negative, lab work thus far is normal and his EKG is normal. The patient is stable for discharge to followup with his family Dr., I have given return precautions and he has expressed understand  Troponin normal  Meds given in ED:  Medications - No data to display  New Prescriptions   NAPROXEN (NAPROSYN) 500 MG TABLET    Take 1 tablet (500 mg total) by mouth 2 (two) times daily with a meal.      Vida Roller, MD 06/02/13 [phone]

## 2013-08-28 ENCOUNTER — Emergency Department (HOSPITAL_COMMUNITY)
Admission: EM | Admit: 2013-08-28 | Discharge: 2013-08-28 | Disposition: A | Discharge status: Home / Self Care | Payer: BC Managed Care – PPO | Attending: Emergency Medicine | Admitting: Emergency Medicine

## 2013-08-28 ENCOUNTER — Encounter (HOSPITAL_COMMUNITY): Payer: Self-pay | Admitting: Emergency Medicine

## 2013-08-28 DIAGNOSIS — I10 Essential (primary) hypertension: Secondary | ICD-10-CM | POA: Insufficient documentation

## 2013-08-28 DIAGNOSIS — M545 Low back pain, unspecified: Secondary | ICD-10-CM | POA: Insufficient documentation

## 2013-08-28 DIAGNOSIS — M549 Dorsalgia, unspecified: Secondary | ICD-10-CM

## 2013-08-28 DIAGNOSIS — Z8739 Personal history of other diseases of the musculoskeletal system and connective tissue: Secondary | ICD-10-CM | POA: Insufficient documentation

## 2013-08-28 DIAGNOSIS — Z79899 Other long term (current) drug therapy: Secondary | ICD-10-CM | POA: Insufficient documentation

## 2013-08-28 MED ORDER — KETOROLAC TROMETHAMINE 60 MG/2ML IM SOLN
60.0000 mg | Freq: Once | INTRAMUSCULAR | Status: AC
  Administered 2013-08-28: 60 mg via INTRAMUSCULAR
  Filled 2013-08-28: qty 2

## 2013-08-28 MED ORDER — METHOCARBAMOL 500 MG PO TABS: 500.0000 mg | ORAL_TABLET | Freq: Two times a day (BID) | ORAL | Status: DC | PRN

## 2013-08-28 MED ORDER — IBUPROFEN 800 MG PO TABS: 800.0000 mg | ORAL_TABLET | Freq: Three times a day (TID) | ORAL | Status: DC

## 2013-08-28 NOTE — ED Provider Notes (Signed)
Medical screening examination/treatment/procedure(s) were performed by non-physician practitioner and as supervising physician I was immediately available for consultation/collaboration.   Junius Argyle, MD 08/28/13 [phone]

## 2013-08-28 NOTE — ED Notes (Signed)
Pt complains of back pain x 2 days. Pt denies falling or trauma to this area at this time.

## 2013-08-28 NOTE — ED Provider Notes (Signed)
CSN: [id number]     Arrival date & time 08/28/13  1117 History   First MD Initiated Contact with Patient 08/28/13 1128     Chief Complaint  Patient presents with  . Back Pain   (Consider location/radiation/quality/duration/timing/severity/associated sxs/prior Treatment) Patient is a 47 y.o. male presenting with back pain. The history is provided by the patient and medical records.  Back Pain  Pt presents to the ED for low back pain x 2 days.  Pt states he was bent over for long periods of time changing doorknobs and other hardware around the house.  States he went to stand up and had immediate onset of back pain, described as a "tightness".  No direct trauma or falls.  Denies any radiation into the extremities, numbness, or paresthesias.  No loss of bowel or bladder function.  No prior back injury.  Has taken flexeril and aleve without relief.  No chest pain, SOB, or abdominal pain.  Past Medical History  Diagnosis Date  . Hypertension   . Arthritis     osteoarthritis    Past Surgical History  Procedure Laterality Date  . Replacement total knee    . Rotator cuff repair     No family history on file. History  Substance Use Topics  . Smoking status: Never Smoker   . Smokeless tobacco: Not on file  . Alcohol Use: Yes    Review of Systems  Musculoskeletal: Positive for back pain.  All other systems reviewed and are negative.    Allergies  Prednisone  Home Medications   Current Outpatient Rx  Name  Route  Sig  Dispense  Refill  . cetirizine (ZYRTEC) 10 MG tablet   Oral   Take 10 mg by mouth daily.         . naproxen (NAPROSYN) 500 MG tablet   Oral   Take 1 tablet (500 mg total) by mouth 2 (two) times daily with a meal.   30 tablet   0    BP 140/87  Pulse 80  Temp(Src) 98.1 F (36.7 C) (Oral)  Resp 16  SpO2 97%  Physical Exam  Nursing note and vitals reviewed. Constitutional: He is oriented to person, place, and time. He appears well-developed and  well-nourished. No distress.  HENT:  Head: Normocephalic and atraumatic.  Eyes: Conjunctivae and EOM are normal. Pupils are equal, round, and reactive to light.  Neck: Normal range of motion. Neck supple.  Cardiovascular: Normal rate, regular rhythm and normal heart sounds.   Pulmonary/Chest: Effort normal and breath sounds normal. No respiratory distress. He has no wheezes.  Musculoskeletal: Normal range of motion. He exhibits no edema.       Lumbar back: He exhibits tenderness, pain and spasm. He exhibits normal range of motion, no swelling, no edema, no deformity and no laceration.  LS diffusely TTP; no step off, gross deformity, or visible signs of trauma; full ROM maintained with some pain; distal sensation intact, normal gait  Neurological: He is alert and oriented to person, place, and time.  Skin: Skin is warm and dry. He is not diaphoretic.  Psychiatric: He has a normal mood and affect.    ED Course  Procedures (including critical care time) Labs Review Labs Reviewed - No data to display Imaging Review No results found.  MDM   1. Back pain    Atraumatic, low back pain. No concern for aortic dissection or cauda equina. Toradol given in the ED. Rx Robaxin and Motrin. FU with [facility]  if no improvement in the next few days.  Discussed plan with patient, he agreed. Return precautions advised.  Garlon Hatchet, PA-C 08/28/13 1218

## 2014-05-04 ENCOUNTER — Ambulatory Visit (INDEPENDENT_AMBULATORY_CARE_PROVIDER_SITE_OTHER): Payer: BC Managed Care – PPO | Admitting: Cardiology

## 2014-05-04 ENCOUNTER — Encounter: Payer: Self-pay | Admitting: Cardiology

## 2014-05-04 VITALS — BP 122/84 | HR 68 | Ht 73.0 in | Wt 273.0 lb

## 2014-05-04 DIAGNOSIS — R42 Dizziness and giddiness: Secondary | ICD-10-CM

## 2014-05-04 DIAGNOSIS — I10 Essential (primary) hypertension: Secondary | ICD-10-CM

## 2014-05-04 DIAGNOSIS — R000 Tachycardia, unspecified: Secondary | ICD-10-CM

## 2014-05-04 DIAGNOSIS — I495 Sick sinus syndrome: Secondary | ICD-10-CM

## 2014-05-04 DIAGNOSIS — I498 Other specified cardiac arrhythmias: Secondary | ICD-10-CM

## 2014-05-04 MED ORDER — METOPROLOL SUCCINATE ER 25 MG PO TB24: 25.0000 mg | ORAL_TABLET | Freq: Every day | ORAL | Status: DC

## 2014-05-04 NOTE — Patient Instructions (Signed)
1. We are ordering a stress test and an echo and we will call you with the results  2. We will make you an appt. After your test are completed if there is anything abnormal

## 2014-05-04 NOTE — Assessment & Plan Note (Signed)
Controlled. 122/84. Continue Toprol-XL.

## 2014-05-04 NOTE — Assessment & Plan Note (Signed)
H/o symptomatic sinus tachycardia ~ 1 month ago. Symptoms have improved with addition of Toprol-XL. He seems to be tolerating this well. EKG today demonstrates NSR. HR 68 bpm. BP is stable at 122/84. Will continue at current dose of 25 mg daily. Will need to work-up potential etiologies. TSH was recently checked by PCP and was normal. BMP also unremarkable. We will order a 2D echo to assess LV function and to look for structural abnormalities. Will also order an exercise NST to r/o ischemia.

## 2014-05-04 NOTE — Progress Notes (Signed)
Patient ID: Mathew Gonzalez, male   DOB: 10/05/1966, 48 y.o.   MRN: [id number]    05/04/2014 Mathew Gonzalez   09/28/1966  [id number]  Primary Physicia CLOWARD,DAVIS L, MD Primary Cardiologist: New  HPI:  The patient is a 48 y/o male, new to our practice, who was referred by his PCP, Dr. Andi Devonloward, for tachycardia and dizziness. His past medical history is significant for HTN, right TKA ~ 8 years ago and also has h/o rosacea, for which he takes minocycline. He denies any prior cardiac issues. No family history of CAD or SCD. History also negative for HLD, DM, tobacco and drug use, thyroid disorders. No known history of OSA. He has occasional anxiety. Denies any excessive caffeine and alchohol use.   He states that his symptoms first began about a month ago. It began with generalized malaise, but then transitioned to palpitations, dizziness, nausea w/o vomiting, diaphoresis and near syncope. He was seen in Dr. Victoriano Lainlowards office and was noted to be tachycardic. He was started on 25 mg of Toprol XL daily. W/u by Dr. Rolan Lipaoward included a BMP and TSH, both of which were unremarkable. An EKG suggested right ventricular hypertrophy and anteroseptal T wave changes. He was instructed to f/u in our office for further evaluation.  He presents to clinic today stating that his symptoms have improved with the addition of Toprol. He denies any further recurrence in dizziness, palpitations and near syncope since taking the medication. When asked about chest discomfort, he states that he does have occasional substernal chest tightness, that occurs at rest. No radiating. Non exertional and no particular relationship to meals. He is not sure if this is reflux or something else.   Current Outpatient Prescriptions  Medication Sig Dispense Refill  . aspirin EC 81 MG tablet Take 162 mg by mouth daily.      . cetirizine (ZYRTEC) 10 MG tablet Take 10 mg by mouth daily.      . metoprolol succinate (TOPROL-XL) 25 MG 24 hr tablet Take 1  tablet (25 mg total) by mouth daily.  30 tablet  5  . ibuprofen (ADVIL,MOTRIN) 800 MG tablet Take 1 tablet (800 mg total) by mouth 3 (three) times daily.  21 tablet  0  . methocarbamol (ROBAXIN) 500 MG tablet Take 1 tablet (500 mg total) by mouth 2 (two) times daily as needed.  14 tablet  0  . minocycline (MINOCIN,DYNACIN) 100 MG capsule Take 100 mg by mouth.      . naproxen (NAPROSYN) 500 MG tablet Take 1 tablet (500 mg total) by mouth 2 (two) times daily with a meal.  30 tablet  0   No current facility-administered medications for this visit.    Allergies  Allergen Reactions  . Prednisone Anxiety    History   Social History  . Marital Status: Single    Spouse Name: N/A    Number of Children: N/A  . Years of Education: N/A   Occupational History  . Not on file.   Social History Main Topics  . Smoking status: Never Smoker   . Smokeless tobacco: Not on file  . Alcohol Use: Yes  . Drug Use: No  . Sexual Activity: Not on file   Other Topics Concern  . Not on file   Social History Narrative  . No narrative on file     Review of Systems: General: negative for chills, fever, night sweats or weight changes.  Cardiovascular: negative for chest pain, dyspnea on exertion, edema, orthopnea, palpitations,  paroxysmal nocturnal dyspnea or shortness of breath Dermatological: negative for rash Respiratory: negative for cough or wheezing Urologic: negative for hematuria Abdominal: negative for nausea, vomiting, diarrhea, bright red blood per rectum, melena, or hematemesis Neurologic: negative for visual changes, syncope, or dizziness All other systems reviewed and are otherwise negative except as noted above.    Blood pressure 122/84, pulse 68, height 6\' 1"  (1.854 m), weight 273 lb (123.832 kg).  General appearance: alert, cooperative and no distress Neck: no carotid bruit and no JVD Lungs: clear to auscultation bilaterally Heart: regular rate and rhythm, S1, S2 normal, no  murmur, click, rub or gallop Extremities: no LEE Pulses: 2+ and symmetric Skin: warm and dry Neurologic: Grossly normal  EKG NSR, 68 bpm. Incomplete RBBB  ASSESSMENT AND PLAN:   Tachycardia H/o symptomatic sinus tachycardia ~ 1 month ago. Symptoms have improved with addition of Toprol-XL. He seems to be tolerating this well. EKG today demonstrates NSR. HR 68 bpm. BP is stable at 122/84. Will continue at current dose of 25 mg daily. Will need to work-up potential etiologies. TSH was recently checked by PCP and was normal. BMP also unremarkable. We will order a 2D echo to assess LV function and to look for structural abnormalities. Will also order an exercise NST to r/o ischemia. He was advised to refrain from excessive caffeine use.   HYPERTENSION Controlled. 122/84. Continue Toprol-XL.    PLAN  The patient was also seen and examined by Dr. Rennis GoldenHilty, who agrees to the above plan. He will need to f/u in 1-2 weeks after 2D echo and NST are complete.   Brittainy SimmonsPA-C 05/04/2014 9:06 AM

## 2014-05-11 ENCOUNTER — Telehealth (HOSPITAL_COMMUNITY): Payer: Self-pay

## 2014-05-17 ENCOUNTER — Ambulatory Visit (HOSPITAL_COMMUNITY)
Admission: RE | Admit: 2014-05-17 | Discharge: 2014-05-17 | Disposition: A | Discharge status: Home / Self Care | Payer: BC Managed Care – PPO | Source: Ambulatory Visit | Attending: Cardiology | Admitting: Cardiology

## 2014-05-17 ENCOUNTER — Ambulatory Visit (HOSPITAL_BASED_OUTPATIENT_CLINIC_OR_DEPARTMENT_OTHER)
Admission: RE | Admit: 2014-05-17 | Discharge: 2014-05-17 | Disposition: A | Discharge status: Home / Self Care | Payer: BC Managed Care – PPO | Source: Ambulatory Visit | Attending: Cardiology | Admitting: Cardiology

## 2014-05-17 DIAGNOSIS — R0609 Other forms of dyspnea: Secondary | ICD-10-CM | POA: Insufficient documentation

## 2014-05-17 DIAGNOSIS — R000 Tachycardia, unspecified: Secondary | ICD-10-CM

## 2014-05-17 DIAGNOSIS — R002 Palpitations: Secondary | ICD-10-CM | POA: Insufficient documentation

## 2014-05-17 DIAGNOSIS — Z6836 Body mass index (BMI) 36.0-36.9, adult: Secondary | ICD-10-CM | POA: Insufficient documentation

## 2014-05-17 DIAGNOSIS — R0989 Other specified symptoms and signs involving the circulatory and respiratory systems: Secondary | ICD-10-CM | POA: Insufficient documentation

## 2014-05-17 DIAGNOSIS — E669 Obesity, unspecified: Secondary | ICD-10-CM | POA: Insufficient documentation

## 2014-05-17 DIAGNOSIS — R42 Dizziness and giddiness: Secondary | ICD-10-CM | POA: Insufficient documentation

## 2014-05-17 DIAGNOSIS — I10 Essential (primary) hypertension: Secondary | ICD-10-CM | POA: Insufficient documentation

## 2014-05-17 DIAGNOSIS — I4510 Unspecified right bundle-branch block: Secondary | ICD-10-CM | POA: Insufficient documentation

## 2014-05-17 DIAGNOSIS — I517 Cardiomegaly: Secondary | ICD-10-CM

## 2014-05-17 DIAGNOSIS — R079 Chest pain, unspecified: Secondary | ICD-10-CM | POA: Insufficient documentation

## 2014-05-17 DIAGNOSIS — R5383 Other fatigue: Secondary | ICD-10-CM

## 2014-05-17 DIAGNOSIS — I498 Other specified cardiac arrhythmias: Secondary | ICD-10-CM

## 2014-05-17 DIAGNOSIS — R5381 Other malaise: Secondary | ICD-10-CM | POA: Insufficient documentation

## 2014-05-17 MED ORDER — TECHNETIUM TC 99M SESTAMIBI GENERIC - CARDIOLITE
31.2000 | Freq: Once | INTRAVENOUS | Status: AC | PRN
  Administered 2014-05-17: 31.2 via INTRAVENOUS

## 2014-05-17 MED ORDER — TECHNETIUM TC 99M SESTAMIBI GENERIC - CARDIOLITE
10.8000 | Freq: Once | INTRAVENOUS | Status: AC | PRN
  Administered 2014-05-17: 11 via INTRAVENOUS

## 2014-05-17 NOTE — Progress Notes (Signed)
2D Echocardiogram Complete.  05/17/2014   Walburga Hudman, RDCS 

## 2014-05-17 NOTE — Procedures (Addendum)
[facility] [address] [place] [address] [phone]  Cardiology Nuclear Med Study  Mathew Gonzalez is a 48 y.o. male     MRN : [id number]     DOB: 02/26/1966  Procedure Date: 05/17/2014  Nuclear Med Background Indication for Stress Test:  Evaluation for Ischemia History:  No prior cardiac or respiratory history reported. No prior NUC MPI for comparison. Cardiac Risk Factors: Hypertension, Obesity, RBBB and tachycardia  Symptoms:  Chest Pain, Dizziness, DOE, Fatigue, Light-Headedness and Palpitations   Nuclear Pre-Procedure Caffeine/Decaff Intake:  7:00pm NPO After: 7:00am   IV Site: R Forearm  IV 0.9% NS with Angio Cath:  22g  Chest Size (in):  48"  IV Started by: Berdie OgrenAmanda Wease, RN  Height: 6\' 1"  (1.854 m)  Cup Size: n/a  BMI:  Body mass index is 36.03 kg/(m^2). Weight:  273 lb (123.832 kg)   Tech Comments:  n/a    Nuclear Med Study 1 or 2 day study: 1 day  Stress Test Type:  Stress  Order Authorizing Provider:  Zoila ShutterKenneth Sophiarose Eades, MD   Resting Radionuclide: Technetium 5821m Sestamibi  Resting Radionuclide Dose: 10.8 mCi   Stress Radionuclide:  Technetium 2221m Sestamibi  Stress Radionuclide Dose: 31.2 mCi           Stress Protocol Rest HR:68 Stress HR: 157  Rest BP: 128/92 Stress BP: 179/85  Exercise Time (min): 11:22 METS: 12.60   Predicted Max HR: 172 bpm % Max HR: 91.28 bpm Rate Pressure Product: [id number]  Dose of Adenosine (mg):  n/a Dose of Lexiscan: n/a mg  Dose of Atropine (mg): n/a Dose of Dobutamine: n/a mcg/kg/min (at max HR)  Stress Test Technologist: Ernestene MentionGwen Farrington, CCT Nuclear Technologist: Gonzella LexPam Phillips, CNMT   Rest Procedure:  Myocardial perfusion imaging was performed at rest 45 minutes following the intravenous administration of Technetium 6321m Sestamibi. Stress Procedure:  The patient performed treadmill exercise using a Bruce  Protocol for 11 minutes and 22 seconds. The patient stopped due to  generalized fatigue. Patient denied any chest pain.  There were no significant ST-T wave changes.  Technetium 4921m Sestamibi was injected IV at peak exercise and myocardial perfusion imaging was performed after a brief delay.  Transient Ischemic Dilatation (Normal <1.22):  0.76 Lung/Heart Ratio (Normal <0.45):  0.33 QGS EDV:  72 ml QGS ESV:  29 ml LV Ejection Fraction: 59%  Rest ECG: NSR-RBBB  Stress ECG: No significant ST segment change suggestive of ischemia.  QPS Raw Data Images:  Normal; no motion artifact; normal heart/lung ratio. Stress Images:  Normal homogeneous uptake in all areas of the myocardium. Rest Images:  Normal homogeneous uptake in all areas of the myocardium. Subtraction (SDS):  No evidence of ischemia.  Impression Exercise Capacity:  Excellent exercise capacity. BP Response:  Normal blood pressure response. Clinical Symptoms:  No significant symptoms noted. ECG Impression:  No significant ST segment change suggestive of ischemia. Comparison with Prior Nuclear Study: No previous nuclear study performed  Overall Impression:  Normal stress nuclear study.  LV Wall Motion:  NL LV Function; NL Wall Motion; EF 59%  Chrystie NoseKenneth C. Tamelia Michalowski, MD, [facility] Childrens HospitalFACC Board Certified in Nuclear Cardiology Attending Cardiologist [facility]  Chrystie NoseKenneth C. Ezell Melikian, MD  05/17/2014 1:12 PM

## 2014-05-24 NOTE — Progress Notes (Signed)
LMTCB - nuc stress test results

## 2014-06-28 NOTE — Telephone Encounter (Signed)
Encounter complete. 

## 2014-07-11 NOTE — Telephone Encounter (Signed)
Encounter complete. 

## 2016-06-02 ENCOUNTER — Emergency Department (HOSPITAL_COMMUNITY): Payer: BLUE CROSS/BLUE SHIELD

## 2016-06-02 ENCOUNTER — Encounter (HOSPITAL_COMMUNITY): Payer: Self-pay

## 2016-06-02 ENCOUNTER — Emergency Department (HOSPITAL_COMMUNITY)
Admission: EM | Admit: 2016-06-02 | Discharge: 2016-06-02 | Disposition: A | Discharge status: Home / Self Care | Payer: BLUE CROSS/BLUE SHIELD | Attending: Emergency Medicine | Admitting: Emergency Medicine

## 2016-06-02 DIAGNOSIS — R079 Chest pain, unspecified: Secondary | ICD-10-CM | POA: Insufficient documentation

## 2016-06-02 DIAGNOSIS — I10 Essential (primary) hypertension: Secondary | ICD-10-CM | POA: Insufficient documentation

## 2016-06-02 DIAGNOSIS — M1990 Unspecified osteoarthritis, unspecified site: Secondary | ICD-10-CM | POA: Insufficient documentation

## 2016-06-02 DIAGNOSIS — Z791 Long term (current) use of non-steroidal anti-inflammatories (NSAID): Secondary | ICD-10-CM | POA: Insufficient documentation

## 2016-06-02 DIAGNOSIS — Z7982 Long term (current) use of aspirin: Secondary | ICD-10-CM | POA: Diagnosis not present

## 2016-06-02 DIAGNOSIS — Z79899 Other long term (current) drug therapy: Secondary | ICD-10-CM | POA: Insufficient documentation

## 2016-06-02 DIAGNOSIS — R0602 Shortness of breath: Secondary | ICD-10-CM | POA: Diagnosis not present

## 2016-06-02 DIAGNOSIS — R5383 Other fatigue: Secondary | ICD-10-CM

## 2016-06-02 LAB — CBC
HEMATOCRIT: 43.6 % (ref 39.0–52.0)
Hemoglobin: 14.3 g/dL (ref 13.0–17.0)
MCH: 29.1 pg (ref 26.0–34.0)
MCHC: 32.8 g/dL (ref 30.0–36.0)
MCV: 88.6 fL (ref 78.0–100.0)
Platelets: 203 10*3/uL (ref 150–400)
RBC: 4.92 MIL/uL (ref 4.22–5.81)
RDW: 12.9 % (ref 11.5–15.5)
WBC: 8.6 10*3/uL (ref 4.0–10.5)

## 2016-06-02 LAB — BASIC METABOLIC PANEL
Anion gap: 7 (ref 5–15)
BUN: 20 mg/dL (ref 6–20)
CHLORIDE: 106 mmol/L (ref 101–111)
CO2: 26 mmol/L (ref 22–32)
CREATININE: 0.89 mg/dL (ref 0.61–1.24)
Calcium: 9.6 mg/dL (ref 8.9–10.3)
GFR calc Af Amer: >60 mL/min (ref >60)
GLUCOSE: 93 mg/dL (ref 65–99)
POTASSIUM: 4.1 mmol/L (ref 3.5–5.1)
Sodium: 139 mmol/L (ref 135–145)

## 2016-06-02 LAB — I-STAT TROPONIN, ED
TROPONIN I, POC: 0 ng/mL (ref 0.00–0.08)
Troponin i, poc: 0 ng/mL (ref 0.00–0.08)

## 2016-06-02 MED ORDER — DIPHENHYDRAMINE HCL 50 MG/ML IJ SOLN
50.0000 mg | Freq: Once | INTRAMUSCULAR | Status: AC
  Administered 2016-06-02: 50 mg via INTRAVENOUS

## 2016-06-02 MED ORDER — IOPAMIDOL (ISOVUE-370) INJECTION 76%
INTRAVENOUS | Status: AC
  Administered 2016-06-02: 100 mL
  Filled 2016-06-02: qty 100

## 2016-06-02 MED ORDER — METHYLPREDNISOLONE SODIUM SUCC 125 MG IJ SOLR
125.0000 mg | Freq: Once | INTRAMUSCULAR | Status: AC
  Administered 2016-06-02: 125 mg via INTRAVENOUS

## 2016-06-02 MED ORDER — SODIUM CHLORIDE 0.9 % IV BOLUS (SEPSIS)
1000.0000 mL | Freq: Once | INTRAVENOUS | Status: AC
  Administered 2016-06-02: 1000 mL via INTRAVENOUS

## 2016-06-02 NOTE — ED Notes (Signed)
CT tech called stated pt is having a reaction to contrast itching and hives

## 2016-06-02 NOTE — ED Provider Notes (Signed)
CSN: [id number]     Arrival date & time 06/02/16  1627 History   First MD Initiated Contact with Patient 06/02/16 1941     Chief Complaint  Patient presents with  . Chest Pain     (Consider location/radiation/quality/duration/timing/severity/associated sxs/prior Treatment) HPI  50 year old male with a history of hypertension presents with a chief complaint of weakness and fatigue. Patient states that for at least 2 weeks he has been feeling fatigued throughout the day. Especially notes it at work. Over the last "few days" he's been having chest pain intermittently as well. All is associated with the fatigue. Some shortness of breath. Seen his PCP and a cardiologist but no obvious diagnosis made. Plan is for a stress test in about one month. Patient currently is not having chest pain. There are no lateralizing symptoms. Patient wanted to get his heart checked out because of the continued chest pain.  Past Medical History  Diagnosis Date  . Hypertension   . Arthritis     osteoarthritis    Past Surgical History  Procedure Laterality Date  . Replacement total knee    . Rotator cuff repair     No family history on file. Social History  Substance Use Topics  . Smoking status: Never Smoker   . Smokeless tobacco: None  . Alcohol Use: Yes    Review of Systems  Constitutional: Positive for fatigue. Negative for fever.  Respiratory: Positive for shortness of breath.   Cardiovascular: Positive for chest pain.  Gastrointestinal: Negative for vomiting and abdominal pain.  Neurological: Negative for numbness.  All other systems reviewed and are negative.     Allergies  Prednisone  Home Medications   Prior to Admission medications   Medication Sig Start Date End Date Taking? Authorizing Provider  aspirin EC 81 MG tablet Take 162 mg by mouth daily.    Historical Provider, MD  cetirizine (ZYRTEC) 10 MG tablet Take 10 mg by mouth daily.    Historical Provider, MD  ibuprofen  (ADVIL,MOTRIN) 800 MG tablet Take 1 tablet (800 mg total) by mouth 3 (three) times daily. 08/28/13   Garlon Hatchet, PA-C  methocarbamol (ROBAXIN) 500 MG tablet Take 1 tablet (500 mg total) by mouth 2 (two) times daily as needed. 08/28/13   Garlon Hatchet, PA-C  metoprolol succinate (TOPROL-XL) 25 MG 24 hr tablet Take 1 tablet (25 mg total) by mouth daily. 05/04/14   Brittainy Sherlynn Carbon, PA-C  minocycline (MINOCIN,DYNACIN) 100 MG capsule Take 100 mg by mouth.    Historical Provider, MD  naproxen (NAPROSYN) 500 MG tablet Take 1 tablet (500 mg total) by mouth 2 (two) times daily with a meal. 06/02/13   Eber Hong, MD   BP 116/73 mmHg  Pulse 62  Temp(Src) 98.2 F (36.8 C) (Oral)  Resp 16  Ht  (1.854 m)  Wt 250 lb (113.399 kg)  BMI 32.99 kg/m2  SpO2 98% Physical Exam  Constitutional: He is oriented to person, place, and time. He appears well-developed and well-nourished.  HENT:  Head: Normocephalic and atraumatic.  Right Ear: External ear normal.  Left Ear: External ear normal.  Nose: Nose normal.  Eyes: Right eye exhibits no discharge. Left eye exhibits no discharge.  Neck: Neck supple.  Cardiovascular: Normal rate, regular rhythm, normal heart sounds and intact distal pulses.   Pulmonary/Chest: Effort normal and breath sounds normal. He has no wheezes. He has no rales. He exhibits no tenderness.  Abdominal: Soft. He exhibits no distension. There is no tenderness.  Musculoskeletal: He exhibits no edema.  Neurological: He is alert and oriented to person, place, and time.  Skin: Skin is warm and dry. He is not diaphoretic.  Nursing note and vitals reviewed.   ED Course  Procedures (including critical care time) Labs Review Labs Reviewed  BASIC METABOLIC PANEL  CBC  I-STAT TROPOININ, ED  I-STAT TROPOININ, ED    Imaging Review Dg Chest 2 View  06/02/2016  CLINICAL DATA:  LEFT side chest pain radiating to LEFT shoulder with shortness of breath for 2 weeks, history hypertension,  smokes up light, initial encounter EXAM: CHEST  2 VIEW COMPARISON:  03/04/2015 ; correlation CT abdomen and pelvis 06/21/2009 FINDINGS: Normal heart size and pulmonary vascularity. New abnormal density identified above the diaphragm on the lateral view anteriorly, approximately 5.1 cm diameter, uncertain etiology. Lungs clear. No pleural effusion or pneumothorax. Bones unremarkable. IMPRESSION: New abnormal soft tissue density at the anterior lung bases on the lateral view, not localized on PA view, approximately 5.1 cm AP, uncertain etiology. Differential diagnosis would include soft tissue mass/tumor, adenopathy, mediastinal cyst, and anterior diaphragmatic or Morgagni hernia. Further assessment by CT chest with contrast recommended. Electronically Signed   By: Ulyses SouthwardMark  Boles M.D.   On: 06/02/2016 16:57   Ct Angio Chest Pe W/cm &/or Wo Cm  06/02/2016  CLINICAL DATA:  Shortness of breath for 2 weeks. EXAM: CT ANGIOGRAPHY CHEST WITH CONTRAST TECHNIQUE: Multidetector CT imaging of the chest was performed using the standard protocol during bolus administration of intravenous contrast. Multiplanar CT image reconstructions and MIPs were obtained to evaluate the vascular anatomy. CONTRAST:  97 cc Isovue 370 intravenously. COMPARISON:  Chest radiograph 06/02/2016 FINDINGS: Mediastinum/Lymph Nodes: No pulmonary emboli or thoracic aortic dissection identified. No masses or pathologically enlarged lymph nodes identified. The heart is normal in size. There is no pericardial effusion. Ball bullous appearing inferior vena cava, or prominent left para diaphragmatic fat extending into the major fissure may account for radiographically seen abnormality. Lungs/Pleura: No pulmonary mass, infiltrate, or effusion. Upper abdomen: No acute findings. Musculoskeletal: No chest wall mass or suspicious bone lesions identified. Review of the MIP images confirms the above findings. IMPRESSION: No evidence of acute abnormality within the chest.  Electronically Signed   By: Ted Mcalpineobrinka  Dimitrova M.D.   On: 06/02/2016 21:11   I have personally reviewed and evaluated these images and lab results as part of my medical decision-making.   EKG Interpretation   Date/Time:  Tuesday June 02 2016 16:40:08 EDT Ventricular Rate:  76 PR Interval:  166 QRS Duration: 104 QT Interval:  386 QTC Calculation: 434 R Axis:   48 Text Interpretation:  Normal sinus rhythm Incomplete right bundle branch  block Borderline ECG no significant change since June 2014 Confirmed by  Criss AlvineGOLDSTON MD, Aoife Bold [id number]) on 06/02/2016 7:33:24 PM      MDM   Final diagnoses:  Other fatigue  Nonspecific chest pain    Patient with fatigue while at work for the past 2 weeks. ECG shows right bundle branch block but this is not new since 2014. He recently saw PCP and cardiologist for the same. Now he stating his having vague intermittent chest pain for the last few days. His HEART score is a 3. He is not currently having pain. Has 2 negative troponins, I feel he does not need emergent hospital workup for his chest pain. Advised him to follow-up close it with cardiology. CT was obtained due to abnormal findings on chest x-ray but the CT is clear. No  PE or other abnormal findings. Discussed return precautions.  Of note, patient appears to have had an allergic reaction to the CT IV dye. He developed itching in ears and throat after IV dye, improved w/o treatment. Given Benadryl given some residual itching by the time I saw him. However he had no throat closing sensation or anaphylaxis.  Pricilla Loveless, MD 06/03/16 [phone]

## 2016-06-02 NOTE — ED Notes (Addendum)
Patient complains of 2 weeks of left anterior chestpain with radiation to left shoulder. Saw his primary MD last week and all normal. Pain mild on arrival. Also seen by cardiologist in [place] but didn't want to wait the month until his outpatient test are completed. No distress

## 2016-06-02 NOTE — ED Notes (Signed)
Pt stable, ambulatory, states understanding of discharge instructions 

## 2017-11-12 ENCOUNTER — Emergency Department (HOSPITAL_BASED_OUTPATIENT_CLINIC_OR_DEPARTMENT_OTHER): Payer: BLUE CROSS/BLUE SHIELD

## 2017-11-12 ENCOUNTER — Other Ambulatory Visit: Payer: Self-pay

## 2017-11-12 ENCOUNTER — Encounter (HOSPITAL_BASED_OUTPATIENT_CLINIC_OR_DEPARTMENT_OTHER): Payer: Self-pay | Admitting: Emergency Medicine

## 2017-11-12 ENCOUNTER — Emergency Department (HOSPITAL_BASED_OUTPATIENT_CLINIC_OR_DEPARTMENT_OTHER)
Admission: EM | Admit: 2017-11-12 | Discharge: 2017-11-12 | Disposition: A | Discharge status: Home / Self Care | Payer: BLUE CROSS/BLUE SHIELD | Attending: Emergency Medicine | Admitting: Emergency Medicine

## 2017-11-12 DIAGNOSIS — R1031 Right lower quadrant pain: Secondary | ICD-10-CM | POA: Insufficient documentation

## 2017-11-12 DIAGNOSIS — Z79899 Other long term (current) drug therapy: Secondary | ICD-10-CM | POA: Diagnosis not present

## 2017-11-12 DIAGNOSIS — R1030 Lower abdominal pain, unspecified: Secondary | ICD-10-CM | POA: Diagnosis present

## 2017-11-12 DIAGNOSIS — Z7982 Long term (current) use of aspirin: Secondary | ICD-10-CM | POA: Diagnosis not present

## 2017-11-12 DIAGNOSIS — I10 Essential (primary) hypertension: Secondary | ICD-10-CM | POA: Insufficient documentation

## 2017-11-12 DIAGNOSIS — R110 Nausea: Secondary | ICD-10-CM | POA: Diagnosis not present

## 2017-11-12 DIAGNOSIS — K5792 Diverticulitis of intestine, part unspecified, without perforation or abscess without bleeding: Secondary | ICD-10-CM

## 2017-11-12 DIAGNOSIS — Z96659 Presence of unspecified artificial knee joint: Secondary | ICD-10-CM | POA: Insufficient documentation

## 2017-11-12 LAB — COMPREHENSIVE METABOLIC PANEL
ALBUMIN: 4.3 g/dL (ref 3.5–5.0)
ALT: 17 U/L (ref 17–63)
AST: 17 U/L (ref 15–41)
Alkaline Phosphatase: 73 U/L (ref 38–126)
Anion gap: 7 (ref 5–15)
BUN: 16 mg/dL (ref 6–20)
CALCIUM: 9 mg/dL (ref 8.9–10.3)
CHLORIDE: 103 mmol/L (ref 101–111)
CO2: 27 mmol/L (ref 22–32)
CREATININE: 0.86 mg/dL (ref 0.61–1.24)
GFR calc Af Amer: >60 mL/min (ref >60)
GFR calc non Af Amer: >60 mL/min (ref >60)
GLUCOSE: 84 mg/dL (ref 65–99)
Potassium: 3.8 mmol/L (ref 3.5–5.1)
Sodium: 137 mmol/L (ref 135–145)
Total Bilirubin: 0.7 mg/dL (ref 0.3–1.2)
Total Protein: 7.4 g/dL (ref 6.5–8.1)

## 2017-11-12 LAB — CBC WITH DIFFERENTIAL/PLATELET
BASOS ABS: 0 10*3/uL (ref 0.0–0.1)
BASOS PCT: 0 %
EOS ABS: 0.2 10*3/uL (ref 0.0–0.7)
EOS PCT: 2 %
HEMATOCRIT: 47.4 % (ref 39.0–52.0)
Hemoglobin: 15.9 g/dL (ref 13.0–17.0)
Lymphocytes Relative: 23 %
Lymphs Abs: 2.3 10*3/uL (ref 0.7–4.0)
MCH: 30.4 pg (ref 26.0–34.0)
MCHC: 33.5 g/dL (ref 30.0–36.0)
MCV: 90.6 fL (ref 78.0–100.0)
MONO ABS: 0.9 10*3/uL (ref 0.1–1.0)
Monocytes Relative: 8 %
NEUTROS ABS: 6.8 10*3/uL (ref 1.7–7.7)
Neutrophils Relative %: 67 %
PLATELETS: 183 10*3/uL (ref 150–400)
RBC: 5.23 MIL/uL (ref 4.22–5.81)
RDW: 12.9 % (ref 11.5–15.5)
WBC: 10.3 10*3/uL (ref 4.0–10.5)

## 2017-11-12 LAB — URINALYSIS, ROUTINE W REFLEX MICROSCOPIC
BILIRUBIN URINE: NEGATIVE
GLUCOSE, UA: NEGATIVE mg/dL
Hgb urine dipstick: NEGATIVE
KETONES UR: NEGATIVE mg/dL
Leukocytes, UA: NEGATIVE
Nitrite: NEGATIVE
PH: 6 (ref 5.0–8.0)
Protein, ur: NEGATIVE mg/dL
SPECIFIC GRAVITY, URINE: 1.01 (ref 1.005–1.030)

## 2017-11-12 MED ORDER — CIPROFLOXACIN HCL 500 MG PO TABS: 500.0000 mg | ORAL_TABLET | Freq: Two times a day (BID) | ORAL | 0 refills | Status: DC

## 2017-11-12 MED ORDER — METRONIDAZOLE 500 MG PO TABS
500.0000 mg | ORAL_TABLET | Freq: Once | ORAL | Status: AC
  Administered 2017-11-12: 500 mg via ORAL
  Filled 2017-11-12: qty 1

## 2017-11-12 MED ORDER — SODIUM CHLORIDE 0.9 % IV BOLUS (SEPSIS)
1000.0000 mL | Freq: Once | INTRAVENOUS | Status: AC
  Administered 2017-11-12: 1000 mL via INTRAVENOUS

## 2017-11-12 MED ORDER — METRONIDAZOLE 500 MG PO TABS: 500.0000 mg | ORAL_TABLET | Freq: Two times a day (BID) | ORAL | 0 refills | Status: DC

## 2017-11-12 MED ORDER — SODIUM CHLORIDE 0.9 % IV BOLUS (SEPSIS): 1000.0000 mL | Freq: Once | INTRAVENOUS | Status: DC

## 2017-11-12 MED ORDER — CIPROFLOXACIN HCL 500 MG PO TABS
500.0000 mg | ORAL_TABLET | Freq: Once | ORAL | Status: AC
  Administered 2017-11-12: 500 mg via ORAL
  Filled 2017-11-12: qty 1

## 2017-11-12 NOTE — ED Triage Notes (Signed)
Patient states that he went to the urgent care for lower abdominal pain  - patient had "clear urine" and elevated WBC at the urgent care. Sent here for further evaluation.

## 2017-11-12 NOTE — Discharge Instructions (Signed)
Take cipro and flagyl for a week.   Take tylenol, motrin for pain or fever.   See your doctor next week   Return to ER if you have worse abdominal pain, vomiting, fever, dehydration

## 2017-11-12 NOTE — ED Provider Notes (Signed)
[facility] EMERGENCY DEPARTMENT Provider Note   CSN: [id number] Arrival date & time: 11/12/17  1857     History   Chief Complaint Chief Complaint  Patient presents with  . Abdominal Pain    HPI Mathew Gonzalez is a 51 y.o. male is free of hypertension, arthritis here presenting with abdominal pain.  Patient has lower abdominal pain and right lower quadrant pain since this morning.  Patient has some suprapubic pressure as well.  Feeling nauseated but no vomiting.  She went to urgent care and had a white blood cell count of 13, normal urinalysis.  Patient was sent here for CT abdomen pelvis. Patient had previous contrast dye allergy.   The history is provided by the patient.    Past Medical History:  Diagnosis Date  . Arthritis    osteoarthritis   . Hypertension     Patient Active Problem List   Diagnosis Date Noted  . Tachycardia 05/04/2014  . ALLERGIC RHINITIS 08/21/2009  . INSOMNIA 08/21/2009  . HEADACHE, CHRONIC 08/21/2009  . BIPOLAR DISORDER UNSPECIFIED 08/20/2009  . HYPERTENSION 08/20/2009  . UNSPECIFIED FUNCTIONAL DISORDER OF STOMACH 08/20/2009  . ALCOHOL ABUSE, HX OF 08/20/2009  . NEPHROLITHIASIS, HX OF 08/20/2009  . KNEE REPLACEMENT, HX OF 08/20/2009  . Other postprocedural status(V45.89) 08/20/2009    Past Surgical History:  Procedure Laterality Date  . REPLACEMENT TOTAL KNEE    . ROTATOR CUFF REPAIR         Home Medications    Prior to Admission medications   Medication Sig Start Date End Date Taking? Authorizing Provider  aspirin EC 81 MG tablet Take 162 mg by mouth daily.    [provider]  cetirizine (ZYRTEC) 10 MG tablet Take 10 mg by mouth daily.    [provider]  ibuprofen (ADVIL,MOTRIN) 800 MG tablet Take 1 tablet (800 mg total) by mouth 3 (three) times daily. 08/28/13   Garlon HatchetSanders, Lisa M, PA-C  methocarbamol (ROBAXIN) 500 MG tablet Take 1 tablet (500 mg total) by mouth 2 (two) times daily as needed. 08/28/13    Garlon HatchetSanders, Lisa M, PA-C  metoprolol succinate (TOPROL-XL) 25 MG 24 hr tablet Take 1 tablet (25 mg total) by mouth daily. 05/04/14   Robbie LisSimmons, Brittainy M, PA-C  minocycline (MINOCIN,DYNACIN) 100 MG capsule Take 100 mg by mouth.    [provider]  naproxen (NAPROSYN) 500 MG tablet Take 1 tablet (500 mg total) by mouth 2 (two) times daily with a meal. 06/02/13   Eber HongMiller, Brian, MD    Family History History reviewed. No pertinent family history.  Social History Social History   Tobacco Use  . Smoking status: Never Smoker  . Smokeless tobacco: Never Used  Substance Use Topics  . Alcohol use: Yes  . Drug use: No     Allergies   Ivp dye [iodinated diagnostic agents] and Prednisone   Review of Systems Review of Systems  Gastrointestinal: Positive for abdominal pain.  All other systems reviewed and are negative.    Physical Exam Updated Vital Signs BP 108/72 (BP Location: Right Arm)   Pulse 60   Temp 98.1 F (36.7 C) (Oral)   Resp 18   Ht 6\' 1"  (1.854 m)   Wt 107 kg (236 lb)   SpO2 99%   BMI 31.14 kg/m   Physical Exam  Constitutional: He appears well-developed.  HENT:  Head: Normocephalic.  Eyes: Pupils are equal, round, and reactive to light.  Cardiovascular: Normal rate.  Pulmonary/Chest: Effort normal.  Abdominal:  Normal appearance and bowel sounds are normal. There is tenderness in the right lower quadrant and suprapubic area.  Neurological: He is alert.  Skin: Skin is warm.  Nursing note and vitals reviewed.    ED Treatments / Results  Labs (all labs ordered are listed, but only abnormal results are displayed) Labs Reviewed  CBC WITH DIFFERENTIAL/PLATELET  COMPREHENSIVE METABOLIC PANEL  URINALYSIS, ROUTINE W REFLEX MICROSCOPIC    EKG  EKG Interpretation None       Radiology Ct Abdomen Pelvis Wo Contrast  Result Date: 11/12/2017 CLINICAL DATA:  Lower abdominal pain EXAM: CT ABDOMEN AND PELVIS WITHOUT CONTRAST TECHNIQUE: Multidetector CT  imaging of the abdomen and pelvis was performed following the standard protocol without IV contrast. COMPARISON:  CT abdomen pelvis 06/21/2009 FINDINGS: Lower chest: No pulmonary nodules or pleural effusion. No visible pericardial effusion. Hepatobiliary: Normal hepatic contours and density. No visible biliary dilatation. Normal gallbladder. Pancreas: Normal contours without ductal dilatation. No peripancreatic fluid collection. Spleen: Normal. Adrenals/Urinary Tract: --Adrenal glands: Normal. --Right kidney/ureter: No hydronephrosis or perinephric stranding. No nephrolithiasis. No obstructing ureteral stones. --Left kidney/ureter: No hydronephrosis or perinephric stranding. No nephrolithiasis. No obstructing ureteral stones. --Urinary bladder: Unremarkable. Stomach/Bowel: --Stomach/Duodenum: No hiatal hernia or other gastric abnormality. Normal duodenal course and caliber. --Small bowel: No dilatation or inflammation. --Colon: There is mild rectosigmoid diverticulosis and mild inflammation surrounding the proximal sigmoid colon (Series 2, image 66; series 5, image 42 and series 6, image 81 --Appendix: Normal. Vascular/Lymphatic: Normal course and caliber of the major abdominal vessels. No abdominal or pelvic lymphadenopathy. Reproductive: Normal prostate and seminal vesicles. Musculoskeletal. No bony spinal canal stenosis or focal osseous abnormality. Other: None. IMPRESSION: 1. Mild rectosigmoid diverticulosis with mild surrounding inflammation, consistent with acute diverticulitis. 2. No abscess or free intraperitoneal air. Electronically Signed   By: Deatra RobinsonKevin  Herman M.D.   On: 11/12/2017 21:58    Procedures Procedures (including critical care time)  Medications Ordered in ED Medications  sodium chloride 0.9 % bolus 1,000 mL (0 mLs Intravenous Stopped 11/12/17 2225)  ciprofloxacin (CIPRO) tablet 500 mg (500 mg Oral Given 11/12/17 2226)  metroNIDAZOLE (FLAGYL) tablet 500 mg (500 mg Oral Given 11/12/17  2226)     Initial Impression / Assessment and Plan / ED Course  I have reviewed the triage vital signs and the nursing notes.  Pertinent labs & imaging results that were available during my care of the patient were reviewed by me and considered in my medical decision making (see chart for details).     Mathew Gonzalez is a 51 y.o. male here with RLQ and suprapubic pain, leukocytosis. Consider appy. Will repeat CBC, UA. Will get CT ab/pel.   10:35 PM WBC nl. UA nl. Chemistry unremarkable. CT showed mild diverticulitis with no abscess. Afebrile. Given cipro, flagyl, will dc home with same.   Final Clinical Impressions(s) / ED Diagnoses   Final diagnoses:  None    ED Discharge Orders    None       Charlynne PanderYao, Shyane Fossum Hsienta, MD 11/12/17 2236

## 2018-11-05 ENCOUNTER — Emergency Department (HOSPITAL_COMMUNITY)
Admission: EM | Admit: 2018-11-05 | Discharge: 2018-11-06 | Disposition: A | Discharge status: Home / Self Care | Payer: BLUE CROSS/BLUE SHIELD | Attending: Emergency Medicine | Admitting: Emergency Medicine

## 2018-11-05 ENCOUNTER — Emergency Department (HOSPITAL_COMMUNITY): Payer: BLUE CROSS/BLUE SHIELD

## 2018-11-05 ENCOUNTER — Encounter (HOSPITAL_COMMUNITY): Payer: Self-pay | Admitting: Emergency Medicine

## 2018-11-05 ENCOUNTER — Other Ambulatory Visit: Payer: Self-pay

## 2018-11-05 DIAGNOSIS — R0789 Other chest pain: Secondary | ICD-10-CM | POA: Insufficient documentation

## 2018-11-05 DIAGNOSIS — R1010 Upper abdominal pain, unspecified: Secondary | ICD-10-CM | POA: Diagnosis not present

## 2018-11-05 DIAGNOSIS — F17290 Nicotine dependence, other tobacco product, uncomplicated: Secondary | ICD-10-CM | POA: Diagnosis not present

## 2018-11-05 DIAGNOSIS — F319 Bipolar disorder, unspecified: Secondary | ICD-10-CM | POA: Diagnosis not present

## 2018-11-05 DIAGNOSIS — I10 Essential (primary) hypertension: Secondary | ICD-10-CM | POA: Insufficient documentation

## 2018-11-05 DIAGNOSIS — F1010 Alcohol abuse, uncomplicated: Secondary | ICD-10-CM | POA: Diagnosis not present

## 2018-11-05 DIAGNOSIS — Z79899 Other long term (current) drug therapy: Secondary | ICD-10-CM | POA: Diagnosis not present

## 2018-11-05 DIAGNOSIS — R079 Chest pain, unspecified: Secondary | ICD-10-CM | POA: Diagnosis present

## 2018-11-05 DIAGNOSIS — Z96659 Presence of unspecified artificial knee joint: Secondary | ICD-10-CM | POA: Diagnosis not present

## 2018-11-05 DIAGNOSIS — Z7982 Long term (current) use of aspirin: Secondary | ICD-10-CM | POA: Diagnosis not present

## 2018-11-05 LAB — I-STAT TROPONIN, ED: Troponin i, poc: 0 ng/mL (ref 0.00–0.08)

## 2018-11-05 MED ORDER — LIDOCAINE VISCOUS HCL 2 % MT SOLN
15.0000 mL | Freq: Once | OROMUCOSAL | Status: AC
  Administered 2018-11-05: 15 mL via ORAL
  Filled 2018-11-05: qty 15

## 2018-11-05 MED ORDER — ALUM & MAG HYDROXIDE-SIMETH 200-200-20 MG/5ML PO SUSP
30.0000 mL | Freq: Once | ORAL | Status: AC
  Administered 2018-11-05: 30 mL via ORAL
  Filled 2018-11-05: qty 30

## 2018-11-05 NOTE — ED Triage Notes (Signed)
Pt here for chest pain since the am with no relief from home pain meds. No radiation. 9/10 squeezing pain. Denies other symptoms

## 2018-11-05 NOTE — ED Notes (Signed)
Patient transported to x-ray. ?

## 2018-11-05 NOTE — ED Provider Notes (Signed)
[facility] EMERGENCY DEPARTMENT Provider Note   CSN: [id number] Arrival date & time: 11/05/18  2217     History   Chief Complaint Chief Complaint  Patient presents with  . Chest Pain    HPI Mathew Gonzalez is a 52 y.o. male.  Patient presents for evaluation of sharp, grabbing, central chest pain that woke him up this morning and has been constant all day. No SOB, nausea, vomiting. He feels the pain more with movement, cough or activity. He reports increased belching and relates similar, less intense pain in the past that resolved with use of Mylanta. He tried using Mylanta today without relief.   The history is provided by the patient. No language interpreter was used.  Chest Pain   Pertinent negatives include no abdominal pain, no fever, no nausea, no shortness of breath, no vomiting and no weakness.    Past Medical History:  Diagnosis Date  . Arthritis    osteoarthritis   . Hypertension     Patient Active Problem List   Diagnosis Date Noted  . Tachycardia 05/04/2014  . ALLERGIC RHINITIS 08/21/2009  . INSOMNIA 08/21/2009  . HEADACHE, CHRONIC 08/21/2009  . BIPOLAR DISORDER UNSPECIFIED 08/20/2009  . HYPERTENSION 08/20/2009  . UNSPECIFIED FUNCTIONAL DISORDER OF STOMACH 08/20/2009  . ALCOHOL ABUSE, HX OF 08/20/2009  . NEPHROLITHIASIS, HX OF 08/20/2009  . KNEE REPLACEMENT, HX OF 08/20/2009  . Other postprocedural status(V45.89) 08/20/2009    Past Surgical History:  Procedure Laterality Date  . REPLACEMENT TOTAL KNEE    . ROTATOR CUFF REPAIR          Home Medications    Prior to Admission medications   Medication Sig Start Date End Date Taking? Authorizing Provider  aspirin EC 81 MG tablet Take 162 mg by mouth daily.    [provider]  cetirizine (ZYRTEC) 10 MG tablet Take 10 mg by mouth daily.    [provider]  ciprofloxacin (CIPRO) 500 MG tablet Take 1 tablet (500 mg total) 2 (two) times daily by mouth. One po bid x 7  days 11/12/17   Charlynne Pander, MD  ibuprofen (ADVIL,MOTRIN) 800 MG tablet Take 1 tablet (800 mg total) by mouth 3 (three) times daily. 08/28/13   Garlon Hatchet, PA-C  methocarbamol (ROBAXIN) 500 MG tablet Take 1 tablet (500 mg total) by mouth 2 (two) times daily as needed. 08/28/13   Garlon Hatchet, PA-C  metoprolol succinate (TOPROL-XL) 25 MG 24 hr tablet Take 1 tablet (25 mg total) by mouth daily. 05/04/14   Robbie Lis M, PA-C  metroNIDAZOLE (FLAGYL) 500 MG tablet Take 1 tablet (500 mg total) 2 (two) times daily by mouth. One po bid x 7 days 11/12/17   Charlynne Pander, MD  minocycline (MINOCIN,DYNACIN) 100 MG capsule Take 100 mg by mouth.    [provider]  naproxen (NAPROSYN) 500 MG tablet Take 1 tablet (500 mg total) by mouth 2 (two) times daily with a meal. 06/02/13   Eber Hong, MD    Family History No family history on file.  Social History Social History   Tobacco Use  . Smoking status: Light Tobacco Smoker    Types: Pipe  . Smokeless tobacco: Never Used  Substance Use Topics  . Alcohol use: Yes  . Drug use: No    Types: Marijuana     Allergies   Ivp dye [iodinated diagnostic agents] and Prednisone   Review of Systems Review of Systems  Constitutional: Negative for chills  and fever.  HENT: Negative.   Respiratory: Negative.  Negative for shortness of breath.   Cardiovascular: Positive for chest pain.  Gastrointestinal: Negative.  Negative for abdominal pain, nausea and vomiting.  Musculoskeletal: Negative.   Skin: Negative.   Neurological: Negative.  Negative for weakness.     Physical Exam Updated Vital Signs BP 102/68   Pulse 85   Resp (!) 23   Wt 104.3 kg   SpO2 95%   BMI 30.34 kg/m   Physical Exam  Constitutional: He is oriented to person, place, and time. He appears well-developed and well-nourished.  HENT:  Head: Normocephalic.  Neck: Normal range of motion. Neck supple.  Cardiovascular: Normal rate and regular rhythm.    Pulmonary/Chest: Effort normal and breath sounds normal. He has no wheezes. He has no rhonchi. He has no rales. He exhibits tenderness (mild left sternal chest tenderness. ).  Abdominal: Soft. Bowel sounds are normal. There is tenderness (Diffuse tenderness across upper abdomen). There is no rebound and no guarding.  Musculoskeletal: Normal range of motion. He exhibits no edema.       Right lower leg: He exhibits no edema.       Left lower leg: He exhibits no edema.  Neurological: He is alert and oriented to person, place, and time.  Skin: Skin is warm and dry. No rash noted.  Psychiatric: He has a normal mood and affect.     ED Treatments / Results  Labs (all labs ordered are listed, but only abnormal results are displayed) Labs Reviewed  CBC WITH DIFFERENTIAL/PLATELET  COMPREHENSIVE METABOLIC PANEL  I-STAT TROPONIN, ED   Results for orders placed or performed during the hospital encounter of 11/05/18  CBC with Differential/Platelet  Result Value Ref Range   WBC 13.3 (H) 4.0 - 10.5 K/uL   RBC 5.88 (H) 4.22 - 5.81 MIL/uL   Hemoglobin 17.5 (H) 13.0 - 17.0 g/dL   HCT 16.1 (H) 09.6 - 04.5 %   MCV 92.3 80.0 - 100.0 fL   MCH 29.8 26.0 - 34.0 pg   MCHC 32.2 30.0 - 36.0 g/dL   RDW 40.9 81.1 - 91.4 %   Platelets 202 150 - 400 K/uL   nRBC 0.0 0.0 - 0.2 %   Neutrophils Relative % 70 %   Neutro Abs 9.3 (H) 1.7 - 7.7 K/uL   Lymphocytes Relative 17 %   Lymphs Abs 2.3 0.7 - 4.0 K/uL   Monocytes Relative 10 %   Monocytes Absolute 1.3 (H) 0.1 - 1.0 K/uL   Eosinophils Relative 2 %   Eosinophils Absolute 0.2 0.0 - 0.5 K/uL   Basophils Relative 1 %   Basophils Absolute 0.1 0.0 - 0.1 K/uL   Immature Granulocytes 0 %   Abs Immature Granulocytes 0.04 0.00 - 0.07 K/uL  Comprehensive metabolic panel  Result Value Ref Range   Sodium 136 135 - 145 mmol/L   Potassium 4.5 3.5 - 5.1 mmol/L   Chloride 102 98 - 111 mmol/L   CO2 30 22 - 32 mmol/L   Glucose, Bld 104 (H) 70 - 99 mg/dL   BUN 19 6 -  20 mg/dL   Creatinine, Ser 7.82 0.61 - 1.24 mg/dL   Calcium 8.9 8.9 - 95.6 mg/dL   Total Protein 6.5 6.5 - 8.1 g/dL   Albumin 3.6 3.5 - 5.0 g/dL   AST 20 15 - 41 U/L   ALT 25 0 - 44 U/L   Alkaline Phosphatase 62 38 - 126 U/L   Total Bilirubin  0.3 0.3 - 1.2 mg/dL   GFR calc non Af Amer >60 >60 mL/min   GFR calc Af Amer >60 >60 mL/min   Anion gap 4 (L) 5 - 15  I-stat troponin, ED  Result Value Ref Range   Troponin i, poc 0.00 0.00 - 0.08 ng/mL   Comment 3             EKG EKG Interpretation  Date/Time:  Saturday November 05 2018 22:29:05 EST Ventricular Rate:  85 PR Interval:    QRS Duration: 109 QT Interval:  345 QTC Calculation: 411 R Axis:   -37 Text Interpretation:  Sinus rhythm Incomplete RBBB and LAFB nonspecific changes since previous  Confirmed by Richardean Canal [phone]) on 11/05/2018 10:35:59 PM   Radiology No results found. Ct Abdomen Pelvis Wo Contrast  Result Date: 11/06/2018 CLINICAL DATA:  Acute onset of epigastric abdominal pain. EXAM: CT ABDOMEN AND PELVIS WITHOUT CONTRAST TECHNIQUE: Multidetector CT imaging of the abdomen and pelvis was performed following the standard protocol without IV contrast. COMPARISON:  CT of the abdomen and pelvis performed 11/12/2017 FINDINGS: Lower chest: Bibasilar opacities may reflect atelectasis or possibly mild pneumonia. The visualized portions of the mediastinum are unremarkable. Hepatobiliary: The liver is unremarkable in appearance. The gallbladder is unremarkable in appearance. The common bile duct remains normal in caliber. Pancreas: The pancreas is within normal limits. Spleen: The spleen is unremarkable in appearance. Adrenals/Urinary Tract: The adrenal glands are unremarkable in appearance. Nonspecific perinephric stranding is noted bilaterally. There is no evidence of hydronephrosis. No renal or ureteral stones are identified. Stomach/Bowel: The stomach is unremarkable in appearance. The small bowel is within normal limits. The  appendix is normal in caliber, without evidence of appendicitis. Mild scattered diverticulosis is noted about much of the colon, without evidence of diverticulitis. Vascular/Lymphatic: The abdominal aorta is unremarkable in appearance. The inferior vena cava is grossly unremarkable. No retroperitoneal lymphadenopathy is seen. No pelvic sidewall lymphadenopathy is identified. Reproductive: The bladder is mildly distended and grossly unremarkable. The prostate remains normal in size. Other: A small umbilical hernia is noted, containing only fat. No significant associated inflammation is seen, though this might explain the patient's symptoms. Musculoskeletal: No acute osseous abnormalities are identified. The visualized musculature is unremarkable in appearance. IMPRESSION: 1. Small umbilical hernia, containing only fat. No significant associated inflammation seen, though this might explain the patient's symptoms. 2. Bibasilar airspace opacities may reflect atelectasis or possibly mild pneumonia. 3. Mild scattered diverticulosis about much of the colon, without evidence of diverticulitis. Electronically Signed   By: Roanna Raider M.D.   On: 11/06/2018 04:03   Dg Chest 2 View  Result Date: 11/05/2018 CLINICAL DATA:  Acute onset of central chest pain and pressure. Shortness of breath. EXAM: CHEST - 2 VIEW COMPARISON:  Chest radiograph and CTA of the chest performed 06/02/2016 FINDINGS: The lungs are well-aerated. Mild bibasilar opacities likely reflect atelectasis. No pleural effusion or pneumothorax is seen. The heart is normal in size; the mediastinal contour is within normal limits. No acute osseous abnormalities are seen. There is mild chronic resorption or resection at the distal right clavicle. IMPRESSION: Mild bibasilar opacities likely reflect atelectasis; lungs otherwise clear. Electronically Signed   By: Roanna Raider M.D.   On: 11/05/2018 23:10    Procedures Procedures (including critical care  time)  Medications Ordered in ED Medications - No data to display   Initial Impression / Assessment and Plan / ED Course  I have reviewed the triage vital signs and the  nursing notes.  Pertinent labs & imaging results that were available during my care of the patient were reviewed by me and considered in my medical decision making (see chart for details).     Patient presents for evaluation of central chest pain, constant, nonradiating, starting earlier this morning. No nausea.   EKG is negative for ischemic changes. Labs are unremarkable, including troponin. CXR clear.   GI cocktail provided as he reported increased belching and similar pain in the past that resolved with Mylanta. No relief with GI cocktail.   On re-examination, the patient continues to be tender to palpation. There is mild leukocytosis. He is examined by Dr. Preston Fleeting who feels a CT of the abdomen is reasonable given pain is significant and unexplained.   CT shows a small umbilical hernia containing fat without signs of inflammation. On exam of this area specifically there is no tenderness or palpable hernia. Not felt to be the source of pain.   He is felt appropriate for discharge home. Do not feel pain represents ACS, abdominal process, infection, PE. Encouraged patient to follow up with PCP for recheck in 2-3 days. Return precautions discussed. He is on a pain management contract so no Rx's were given for pain.   Final Clinical Impressions(s) / ED Diagnoses   Final diagnoses:  None   1. Nonspecific chest pain 2. Abdominal pain  ED Discharge Orders    None       Elpidio Anis, PA-C 11/06/18 0750    Dione Booze, MD 11/06/18 [phone]

## 2018-11-06 ENCOUNTER — Emergency Department (HOSPITAL_COMMUNITY): Payer: BLUE CROSS/BLUE SHIELD

## 2018-11-06 LAB — CBC WITH DIFFERENTIAL/PLATELET
ABS IMMATURE GRANULOCYTES: 0.04 10*3/uL (ref 0.00–0.07)
Basophils Absolute: 0.1 10*3/uL (ref 0.0–0.1)
Basophils Relative: 1 %
EOS ABS: 0.2 10*3/uL (ref 0.0–0.5)
Eosinophils Relative: 2 %
HEMATOCRIT: 54.3 % — AB (ref 39.0–52.0)
Hemoglobin: 17.5 g/dL — ABNORMAL HIGH (ref 13.0–17.0)
Immature Granulocytes: 0 %
LYMPHS ABS: 2.3 10*3/uL (ref 0.7–4.0)
Lymphocytes Relative: 17 %
MCH: 29.8 pg (ref 26.0–34.0)
MCHC: 32.2 g/dL (ref 30.0–36.0)
MCV: 92.3 fL (ref 80.0–100.0)
MONO ABS: 1.3 10*3/uL — AB (ref 0.1–1.0)
MONOS PCT: 10 %
NEUTROS PCT: 70 %
Neutro Abs: 9.3 10*3/uL — ABNORMAL HIGH (ref 1.7–7.7)
Platelets: 202 10*3/uL (ref 150–400)
RBC: 5.88 MIL/uL — ABNORMAL HIGH (ref 4.22–5.81)
RDW: 13.2 % (ref 11.5–15.5)
WBC: 13.3 10*3/uL — ABNORMAL HIGH (ref 4.0–10.5)
nRBC: 0 % (ref 0.0–0.2)

## 2018-11-06 LAB — COMPREHENSIVE METABOLIC PANEL
ALT: 25 U/L (ref 0–44)
AST: 20 U/L (ref 15–41)
Albumin: 3.6 g/dL (ref 3.5–5.0)
Alkaline Phosphatase: 62 U/L (ref 38–126)
Anion gap: 4 — ABNORMAL LOW (ref 5–15)
BILIRUBIN TOTAL: 0.3 mg/dL (ref 0.3–1.2)
BUN: 19 mg/dL (ref 6–20)
CO2: 30 mmol/L (ref 22–32)
CREATININE: 1.1 mg/dL (ref 0.61–1.24)
Calcium: 8.9 mg/dL (ref 8.9–10.3)
Chloride: 102 mmol/L (ref 98–111)
GFR calc Af Amer: >60 mL/min (ref >60)
Glucose, Bld: 104 mg/dL — ABNORMAL HIGH (ref 70–99)
Potassium: 4.5 mmol/L (ref 3.5–5.1)
Sodium: 136 mmol/L (ref 135–145)
TOTAL PROTEIN: 6.5 g/dL (ref 6.5–8.1)

## 2018-11-06 MED ORDER — HYOSCYAMINE SULFATE 0.125 MG SL SUBL
0.1250 mg | SUBLINGUAL_TABLET | Freq: Once | SUBLINGUAL | Status: AC
  Administered 2018-11-06: 0.125 mg via SUBLINGUAL
  Filled 2018-11-06: qty 1

## 2018-11-06 MED ORDER — PANTOPRAZOLE SODIUM 40 MG PO TBEC
40.0000 mg | DELAYED_RELEASE_TABLET | Freq: Every day | ORAL | Status: DC
  Administered 2018-11-06: 40 mg via ORAL
  Filled 2018-11-06: qty 1

## 2018-11-06 MED ORDER — FAMOTIDINE 20 MG PO TABS
20.0000 mg | ORAL_TABLET | Freq: Once | ORAL | Status: AC
  Administered 2018-11-06: 20 mg via ORAL
  Filled 2018-11-06: qty 1

## 2018-11-06 MED ORDER — HYDROMORPHONE HCL 1 MG/ML IJ SOLN
1.0000 mg | Freq: Once | INTRAMUSCULAR | Status: AC
  Administered 2018-11-06: 1 mg via INTRAVENOUS
  Filled 2018-11-06: qty 1

## 2018-11-06 NOTE — ED Notes (Signed)
Recollected labs  

## 2018-11-06 NOTE — ED Notes (Signed)
pts pain no better 

## 2018-11-06 NOTE — ED Notes (Signed)
ED Provider at bedside. 

## 2018-11-06 NOTE — Discharge Instructions (Addendum)
Follow up with Dr. Wynelle Link for recheck in 2-3 days if pain persists. Return to the emergency room with any severe pain, high fever, new symptoms of concern.

## 2018-11-06 NOTE — ED Notes (Signed)
The pts pain is no better  He wants stronger meds

## 2018-11-07 ENCOUNTER — Other Ambulatory Visit: Payer: Self-pay

## 2018-11-07 ENCOUNTER — Observation Stay (HOSPITAL_COMMUNITY)
Admission: EM | Admit: 2018-11-07 | Discharge: 2018-11-09 | Disposition: A | Discharge status: Home / Self Care | Payer: BLUE CROSS/BLUE SHIELD | Attending: Internal Medicine | Admitting: Internal Medicine

## 2018-11-07 ENCOUNTER — Emergency Department (HOSPITAL_COMMUNITY): Payer: BLUE CROSS/BLUE SHIELD

## 2018-11-07 ENCOUNTER — Encounter (HOSPITAL_COMMUNITY): Payer: Self-pay | Admitting: *Deleted

## 2018-11-07 DIAGNOSIS — E860 Dehydration: Secondary | ICD-10-CM | POA: Insufficient documentation

## 2018-11-07 DIAGNOSIS — R739 Hyperglycemia, unspecified: Secondary | ICD-10-CM | POA: Diagnosis not present

## 2018-11-07 DIAGNOSIS — I119 Hypertensive heart disease without heart failure: Secondary | ICD-10-CM | POA: Insufficient documentation

## 2018-11-07 DIAGNOSIS — D72829 Elevated white blood cell count, unspecified: Secondary | ICD-10-CM | POA: Diagnosis present

## 2018-11-07 DIAGNOSIS — R109 Unspecified abdominal pain: Secondary | ICD-10-CM | POA: Diagnosis present

## 2018-11-07 DIAGNOSIS — Z7982 Long term (current) use of aspirin: Secondary | ICD-10-CM | POA: Diagnosis not present

## 2018-11-07 DIAGNOSIS — R1310 Dysphagia, unspecified: Secondary | ICD-10-CM | POA: Diagnosis not present

## 2018-11-07 DIAGNOSIS — R079 Chest pain, unspecified: Secondary | ICD-10-CM | POA: Diagnosis present

## 2018-11-07 DIAGNOSIS — I309 Acute pericarditis, unspecified: Secondary | ICD-10-CM | POA: Insufficient documentation

## 2018-11-07 DIAGNOSIS — Z79899 Other long term (current) drug therapy: Secondary | ICD-10-CM | POA: Diagnosis not present

## 2018-11-07 DIAGNOSIS — E669 Obesity, unspecified: Secondary | ICD-10-CM | POA: Insufficient documentation

## 2018-11-07 DIAGNOSIS — I700 Atherosclerosis of aorta: Secondary | ICD-10-CM | POA: Diagnosis not present

## 2018-11-07 DIAGNOSIS — J189 Pneumonia, unspecified organism: Secondary | ICD-10-CM | POA: Diagnosis not present

## 2018-11-07 DIAGNOSIS — I10 Essential (primary) hypertension: Secondary | ICD-10-CM | POA: Diagnosis not present

## 2018-11-07 DIAGNOSIS — Z791 Long term (current) use of non-steroidal anti-inflammatories (NSAID): Secondary | ICD-10-CM | POA: Insufficient documentation

## 2018-11-07 DIAGNOSIS — F17200 Nicotine dependence, unspecified, uncomplicated: Secondary | ICD-10-CM | POA: Diagnosis present

## 2018-11-07 DIAGNOSIS — F17290 Nicotine dependence, other tobacco product, uncomplicated: Secondary | ICD-10-CM | POA: Insufficient documentation

## 2018-11-07 DIAGNOSIS — F419 Anxiety disorder, unspecified: Secondary | ICD-10-CM | POA: Insufficient documentation

## 2018-11-07 DIAGNOSIS — F319 Bipolar disorder, unspecified: Secondary | ICD-10-CM | POA: Diagnosis not present

## 2018-11-07 DIAGNOSIS — R0789 Other chest pain: Secondary | ICD-10-CM | POA: Diagnosis not present

## 2018-11-07 DIAGNOSIS — Z6833 Body mass index (BMI) 33.0-33.9, adult: Secondary | ICD-10-CM | POA: Insufficient documentation

## 2018-11-07 DIAGNOSIS — F3170 Bipolar disorder, currently in remission, most recent episode unspecified: Secondary | ICD-10-CM | POA: Diagnosis not present

## 2018-11-07 DIAGNOSIS — Z96659 Presence of unspecified artificial knee joint: Secondary | ICD-10-CM | POA: Insufficient documentation

## 2018-11-07 HISTORY — DX: Bipolar disorder, unspecified: F31.9

## 2018-11-07 LAB — GLUCOSE, CAPILLARY
GLUCOSE-CAPILLARY: 106 mg/dL — AB (ref 70–99)
GLUCOSE-CAPILLARY: 110 mg/dL — AB (ref 70–99)
Glucose-Capillary: 105 mg/dL — ABNORMAL HIGH (ref 70–99)

## 2018-11-07 LAB — LIPID PANEL
CHOL/HDL RATIO: 3.1 ratio
CHOLESTEROL: 143 mg/dL (ref 0–200)
HDL: 46 mg/dL (ref >40)
LDL Cholesterol: 90 mg/dL (ref 0–99)
Triglycerides: 34 mg/dL (ref <150)
VLDL: 7 mg/dL (ref 0–40)

## 2018-11-07 LAB — RAPID URINE DRUG SCREEN, HOSP PERFORMED
Amphetamines: NOT DETECTED
BARBITURATES: NOT DETECTED
BENZODIAZEPINES: NOT DETECTED
Cocaine: NOT DETECTED
Opiates: POSITIVE — AB
Tetrahydrocannabinol: NOT DETECTED

## 2018-11-07 LAB — CBC
HCT: 52.9 % — ABNORMAL HIGH (ref 39.0–52.0)
Hemoglobin: 17 g/dL (ref 13.0–17.0)
MCH: 29.5 pg (ref 26.0–34.0)
MCHC: 32.1 g/dL (ref 30.0–36.0)
MCV: 91.8 fL (ref 80.0–100.0)
Platelets: 207 10*3/uL (ref 150–400)
RBC: 5.76 MIL/uL (ref 4.22–5.81)
RDW: 13.2 % (ref 11.5–15.5)
WBC: 16 10*3/uL — ABNORMAL HIGH (ref 4.0–10.5)
nRBC: 0 % (ref 0.0–0.2)

## 2018-11-07 LAB — COMPREHENSIVE METABOLIC PANEL
ALBUMIN: 3.7 g/dL (ref 3.5–5.0)
ALT: 22 U/L (ref 0–44)
AST: 20 U/L (ref 15–41)
Alkaline Phosphatase: 70 U/L (ref 38–126)
Anion gap: 8 (ref 5–15)
BUN: 16 mg/dL (ref 6–20)
CO2: 28 mmol/L (ref 22–32)
Calcium: 9 mg/dL (ref 8.9–10.3)
Chloride: 100 mmol/L (ref 98–111)
Creatinine, Ser: 1.09 mg/dL (ref 0.61–1.24)
GFR calc Af Amer: >60 mL/min (ref >60)
GFR calc non Af Amer: >60 mL/min (ref >60)
Glucose, Bld: 147 mg/dL — ABNORMAL HIGH (ref 70–99)
POTASSIUM: 3.9 mmol/L (ref 3.5–5.1)
Sodium: 136 mmol/L (ref 135–145)
Total Bilirubin: 1.1 mg/dL (ref 0.3–1.2)
Total Protein: 6.7 g/dL (ref 6.5–8.1)

## 2018-11-07 LAB — I-STAT TROPONIN, ED: Troponin i, poc: 0 ng/mL (ref 0.00–0.08)

## 2018-11-07 LAB — TROPONIN I
Troponin I: <0.03 ng/mL (ref <0.03)
Troponin I: <0.03 ng/mL (ref <0.03)

## 2018-11-07 LAB — LIPASE, BLOOD: Lipase: 38 U/L (ref 11–51)

## 2018-11-07 LAB — HEMOGLOBIN A1C
Hgb A1c MFr Bld: 5.2 % (ref 4.8–5.6)
Mean Plasma Glucose: 102.54 mg/dL

## 2018-11-07 LAB — CBG MONITORING, ED: Glucose-Capillary: 123 mg/dL — ABNORMAL HIGH (ref 70–99)

## 2018-11-07 LAB — D-DIMER, QUANTITATIVE: D-Dimer, Quant: 0.32 ug/mL-FEU (ref 0.00–0.50)

## 2018-11-07 LAB — TSH: TSH: 1.335 u[IU]/mL (ref 0.350–4.500)

## 2018-11-07 LAB — HIV ANTIBODY (ROUTINE TESTING W REFLEX): HIV SCREEN 4TH GENERATION: NONREACTIVE

## 2018-11-07 MED ORDER — METOPROLOL TARTRATE 50 MG PO TABS
50.0000 mg | ORAL_TABLET | Freq: Two times a day (BID) | ORAL | Status: DC
  Administered 2018-11-07 (×2): 50 mg via ORAL
  Filled 2018-11-07 (×2): qty 1

## 2018-11-07 MED ORDER — PANTOPRAZOLE SODIUM 40 MG PO TBEC
40.0000 mg | DELAYED_RELEASE_TABLET | Freq: Two times a day (BID) | ORAL | Status: DC
  Administered 2018-11-07 – 2018-11-09 (×4): 40 mg via ORAL
  Filled 2018-11-07 (×4): qty 1

## 2018-11-07 MED ORDER — HYDROCODONE-ACETAMINOPHEN 5-325 MG PO TABS
1.0000 | ORAL_TABLET | Freq: Two times a day (BID) | ORAL | Status: DC | PRN
  Administered 2018-11-07 – 2018-11-08 (×4): 1 via ORAL
  Filled 2018-11-07 (×4): qty 1

## 2018-11-07 MED ORDER — ASPIRIN EC 81 MG PO TBEC
81.0000 mg | DELAYED_RELEASE_TABLET | Freq: Every day | ORAL | Status: DC
  Administered 2018-11-07 – 2018-11-09 (×3): 81 mg via ORAL
  Filled 2018-11-07 (×3): qty 1

## 2018-11-07 MED ORDER — INSULIN ASPART 100 UNIT/ML ~~LOC~~ SOLN
0.0000 [IU] | Freq: Three times a day (TID) | SUBCUTANEOUS | Status: DC
  Administered 2018-11-07: 1 [IU] via SUBCUTANEOUS
  Filled 2018-11-07: qty 1

## 2018-11-07 MED ORDER — SUCRALFATE 1 GM/10ML PO SUSP
1.0000 g | Freq: Three times a day (TID) | ORAL | Status: DC
  Administered 2018-11-07 – 2018-11-08 (×4): 1 g via ORAL
  Filled 2018-11-07 (×5): qty 10

## 2018-11-07 MED ORDER — NICOTINE 14 MG/24HR TD PT24
14.0000 mg | MEDICATED_PATCH | Freq: Every day | TRANSDERMAL | Status: DC
  Filled 2018-11-07 (×3): qty 1

## 2018-11-07 MED ORDER — ZOLPIDEM TARTRATE 5 MG PO TABS
5.0000 mg | ORAL_TABLET | Freq: Every evening | ORAL | Status: DC | PRN
  Administered 2018-11-08: 5 mg via ORAL
  Filled 2018-11-07: qty 1

## 2018-11-07 MED ORDER — DIPHENHYDRAMINE HCL 25 MG PO CAPS: 50.0000 mg | ORAL_CAPSULE | Freq: Once | ORAL | Status: DC

## 2018-11-07 MED ORDER — ALUM & MAG HYDROXIDE-SIMETH 200-200-20 MG/5ML PO SUSP
30.0000 mL | Freq: Once | ORAL | Status: AC
  Administered 2018-11-07: 30 mL via ORAL
  Filled 2018-11-07: qty 30

## 2018-11-07 MED ORDER — FENTANYL CITRATE (PF) 100 MCG/2ML IJ SOLN
50.0000 ug | Freq: Once | INTRAMUSCULAR | Status: AC
  Administered 2018-11-07: 50 ug via INTRAVENOUS
  Filled 2018-11-07: qty 2

## 2018-11-07 MED ORDER — PANTOPRAZOLE SODIUM 40 MG IV SOLR
40.0000 mg | Freq: Once | INTRAVENOUS | Status: AC
  Administered 2018-11-07: 40 mg via INTRAVENOUS
  Filled 2018-11-07: qty 40

## 2018-11-07 MED ORDER — MORPHINE SULFATE (PF) 2 MG/ML IV SOLN
2.0000 mg | INTRAVENOUS | Status: DC | PRN
  Administered 2018-11-07 – 2018-11-08 (×4): 2 mg via INTRAVENOUS
  Filled 2018-11-07 (×4): qty 1

## 2018-11-07 MED ORDER — ONDANSETRON HCL 4 MG/2ML IJ SOLN: 4.0000 mg | Freq: Four times a day (QID) | INTRAMUSCULAR | Status: DC | PRN

## 2018-11-07 MED ORDER — PREDNISONE 20 MG PO TABS
50.0000 mg | ORAL_TABLET | Freq: Four times a day (QID) | ORAL | Status: DC
  Administered 2018-11-08: 50 mg via ORAL
  Filled 2018-11-07: qty 2

## 2018-11-07 MED ORDER — ENOXAPARIN SODIUM 40 MG/0.4ML ~~LOC~~ SOLN
40.0000 mg | Freq: Every day | SUBCUTANEOUS | Status: DC
  Administered 2018-11-07 – 2018-11-09 (×3): 40 mg via SUBCUTANEOUS
  Filled 2018-11-07 (×3): qty 0.4

## 2018-11-07 MED ORDER — DIPHENHYDRAMINE HCL 50 MG/ML IJ SOLN: 50.0000 mg | Freq: Once | INTRAMUSCULAR | Status: DC

## 2018-11-07 MED ORDER — ACETAMINOPHEN 325 MG PO TABS
650.0000 mg | ORAL_TABLET | ORAL | Status: DC | PRN
  Administered 2018-11-07: 650 mg via ORAL
  Filled 2018-11-07: qty 2

## 2018-11-07 MED ORDER — DICYCLOMINE HCL 10 MG PO CAPS
10.0000 mg | ORAL_CAPSULE | Freq: Once | ORAL | Status: AC
  Administered 2018-11-07: 10 mg via ORAL
  Filled 2018-11-07: qty 1

## 2018-11-07 NOTE — ED Notes (Signed)
Pt was asleep and when awakened he stated his pain was better but still a 7 out of 10.

## 2018-11-07 NOTE — ED Triage Notes (Signed)
The pt arrived by [facility] from home  Pt was here yesterday  With the same pain  No diagnosis made  He took a vicodin that he reports does mnot help   He also took a nitro x 3 without any relief  And he also took a zofran

## 2018-11-07 NOTE — H&P (Signed)
History and Physical    Mathew Gonzalez ZOX:[id number] DOB: [birth year] DOA: 11/07/2018  PCP: Salli Real, MD Consultants:  None Patient coming from:  Home - lives with wife; NOK: Wife, [phone]  Chief Complaint: Chest pain  HPI: Mathew Gonzalez is a 52 y.o. male with medical history significant of HTN and bipolar d/o (not on medications for either) presenting with chest pain.  He woke up Friday AM with chest pain.  All day, he had constant pain.  At first he thought it was heartburn or gas but did not improve with Pepcid and other medications.  The pain is substernal.  "It just gets worse and worse - like now, it's in my ears.  I hope it's not an ear infection, can you look in there?"  Nothing made it worse - "I mean, you can't make it worse.  If you eat something it gets worse.  It gets worse with moving around... If you move like 2 inches then it'll start grabbing on you."  Nothing makes it better. "I mean, the pain medicine they gave me in here, that helped."  It has not gone away completely since onset of pain Friday.  It radiated into his jaw last night and now "it's coming back in my jaw right now, as we speak, and it's coming back in my ears.  It kind of has that feeling like it could be in my ears, like an infection.  It could be unrelated.  It's not possible to have a bad tooth that could go into your chest, is it?"   ED Course:  Carryover, per Dr. Loney Loh: Patient presented with a complaint of chest pain and upper abdominal pain to the ED yesterday. Troponin and EKG negative, CT abdomen negative and patient was sent home. He returned to the ED today complaining of worsening chest pain and looked uncomfortable per ED provider. Chest x-ray negative for aortic dissection. D-dimer negative. Repeat EKG today showing Q waves and T wave inversion in lead III. Patient was seen by Dr. Mayford Knife from cardiology who ruled out STEMI. Plan is to admit for cardiac work-up. Also noted to have worsening  leukocytosis, white count 16 today. Patient is afebrile.  Review of Systems: As per HPI; otherwise review of systems reviewed and negative.   Ambulatory Status:  Ambulates without assistance  Past Medical History:  Diagnosis Date  . Arthritis    osteoarthritis, on chronic hydrocodone   . Bipolar disorder (HCC)   . Hypertension    not on medication    Past Surgical History:  Procedure Laterality Date  . REPLACEMENT TOTAL KNEE    . ROTATOR CUFF REPAIR      Social History   Socioeconomic History  . Marital status: Single    Spouse name: Not on file  . Number of children: Not on file  . Years of education: Not on file  . Highest education level: Not on file  Occupational History  . Occupation: Control and instrumentation engineer  Social Needs  . Financial resource strain: Not on file  . Food insecurity:    Worry: Not on file    Inability: Not on file  . Transportation needs:    Medical: Not on file    Non-medical: Not on file  Tobacco Use  . Smoking status: Current Every Day Smoker    Types: Pipe  . Smokeless tobacco: Never Used  Substance and Sexual Activity  . Alcohol use: Not Currently  . Drug use: No    Types:  Marijuana    Comment: remote history  . Sexual activity: Not on file  Lifestyle  . Physical activity:    Days per week: Not on file    Minutes per session: Not on file  . Stress: Not on file  Relationships  . Social connections:    Talks on phone: Not on file    Gets together: Not on file    Attends religious service: Not on file    Active member of club or organization: Not on file    Attends meetings of clubs or organizations: Not on file    Relationship status: Not on file  . Intimate partner violence:    Fear of current or ex partner: Not on file    Emotionally abused: Not on file    Physically abused: Not on file    Forced sexual activity: Not on file  Other Topics Concern  . Not on file  Social History Narrative  . Not on file    Allergies    Allergen Reactions  . Prednisone Anxiety and Other (See Comments)    Hallucinations, also  . Ivp Dye [Iodinated Diagnostic Agents] Itching  . Metrizamide Itching    (Metrizamide is a non-ionic, iodine-based, radiocontrast agent)    Family History  Problem Relation Age of Onset  . Cancer - Lung Father 17  . CAD Neg Hx     Prior to Admission medications   Medication Sig Start Date End Date Taking? Authorizing Provider  HYDROcodone-acetaminophen (NORCO/VICODIN) 5-325 MG tablet Take 1 tablet by mouth 2 (two) times daily as needed for moderate pain.   Yes [provider]  testosterone cypionate (DEPOTESTOTERONE CYPIONATE) 100 MG/ML injection Inject 200 mg into the muscle every 14 (fourteen) days. For IM use only   Yes [provider]  zolpidem (AMBIEN) 10 MG tablet Take 5 mg by mouth at bedtime as needed for sleep.   Yes [provider]  ciprofloxacin (CIPRO) 500 MG tablet Take 1 tablet (500 mg total) 2 (two) times daily by mouth. One po bid x 7 days Patient not taking: Reported on 11/05/2018 11/12/17   Charlynne Pander, MD  ibuprofen (ADVIL,MOTRIN) 800 MG tablet Take 1 tablet (800 mg total) by mouth 3 (three) times daily. Patient not taking: Reported on 11/07/2018 08/28/13   Garlon Hatchet, PA-C  methocarbamol (ROBAXIN) 500 MG tablet Take 1 tablet (500 mg total) by mouth 2 (two) times daily as needed. Patient not taking: Reported on 11/07/2018 08/28/13   Garlon Hatchet, PA-C  metoprolol succinate (TOPROL-XL) 25 MG 24 hr tablet Take 1 tablet (25 mg total) by mouth daily. Patient not taking: Reported on 11/07/2018 05/04/14   Robbie Lis M, PA-C  metroNIDAZOLE (FLAGYL) 500 MG tablet Take 1 tablet (500 mg total) 2 (two) times daily by mouth. One po bid x 7 days Patient not taking: Reported on 11/07/2018 11/12/17   Charlynne Pander, MD  naproxen (NAPROSYN) 500 MG tablet Take 1 tablet (500 mg total) by mouth 2 (two) times daily with a meal. Patient not taking:  Reported on 11/07/2018 06/02/13   Eber Hong, MD    Physical Exam: Vitals:   11/07/18 0400 11/07/18 0600 11/07/18 0630 11/07/18 0700  BP: (!) 100/58 103/71 113/73 123/72  Pulse: 100 93 92 93  Resp: (!) 24 (!) 21 (!) 22 19  Temp:      TempSrc:      SpO2: 95% 97% 96% 97%  Weight:      Height:  General:  Appears calm and comfortable and is NAD Eyes:  PERRL, EOMI, normal lids, iris ENT:  grossly normal hearing, lips & tongue, mmm; TMs clear without erythema or bulging; approopriate dentition with ?caries but no obvious decay Neck:  no LAD, masses or thyromegaly Cardiovascular:  RRR, no m/r/g. No LE edema.  Respiratory:   CTA bilaterally with no wheezes/rales/rhonchi.  Normal respiratory effort. Abdomen:  soft, NT, ND, NABS Back:   normal alignment, no CVAT Skin:  no rash or induration seen on limited exam Musculoskeletal:  grossly normal tone BUE/BLE, good ROM, no bony abnormality Psychiatric:  Mildly anxious mood and affect, speech fluent and appropriate but somewhat rambling, AOx3 Neurologic:  CN 2-12 grossly intact, moves all extremities in coordinated fashion, sensation intact    Radiological Exams on Admission: Ct Abdomen Pelvis Wo Contrast  Result Date: 11/06/2018 CLINICAL DATA:  Acute onset of epigastric abdominal pain. EXAM: CT ABDOMEN AND PELVIS WITHOUT CONTRAST TECHNIQUE: Multidetector CT imaging of the abdomen and pelvis was performed following the standard protocol without IV contrast. COMPARISON:  CT of the abdomen and pelvis performed 11/12/2017 FINDINGS: Lower chest: Bibasilar opacities may reflect atelectasis or possibly mild pneumonia. The visualized portions of the mediastinum are unremarkable. Hepatobiliary: The liver is unremarkable in appearance. The gallbladder is unremarkable in appearance. The common bile duct remains normal in caliber. Pancreas: The pancreas is within normal limits. Spleen: The spleen is unremarkable in appearance. Adrenals/Urinary  Tract: The adrenal glands are unremarkable in appearance. Nonspecific perinephric stranding is noted bilaterally. There is no evidence of hydronephrosis. No renal or ureteral stones are identified. Stomach/Bowel: The stomach is unremarkable in appearance. The small bowel is within normal limits. The appendix is normal in caliber, without evidence of appendicitis. Mild scattered diverticulosis is noted about much of the colon, without evidence of diverticulitis. Vascular/Lymphatic: The abdominal aorta is unremarkable in appearance. The inferior vena cava is grossly unremarkable. No retroperitoneal lymphadenopathy is seen. No pelvic sidewall lymphadenopathy is identified. Reproductive: The bladder is mildly distended and grossly unremarkable. The prostate remains normal in size. Other: A small umbilical hernia is noted, containing only fat. No significant associated inflammation is seen, though this might explain the patient's symptoms. Musculoskeletal: No acute osseous abnormalities are identified. The visualized musculature is unremarkable in appearance. IMPRESSION: 1. Small umbilical hernia, containing only fat. No significant associated inflammation seen, though this might explain the patient's symptoms. 2. Bibasilar airspace opacities may reflect atelectasis or possibly mild pneumonia. 3. Mild scattered diverticulosis about much of the colon, without evidence of diverticulitis. Electronically Signed   By: Roanna Raider M.D.   On: 11/06/2018 04:03   Dg Chest 2 View  Result Date: 11/07/2018 CLINICAL DATA:  Initial evaluation for acute chest and epigastric pain. EXAM: CHEST - 2 VIEW COMPARISON:  Prior radiograph from 11/05/2018. FINDINGS: Mild cardiomegaly, stable. Mediastinal silhouette normal. Aortic atherosclerosis. Lungs hypoinflated. Mild streaky bibasilar opacities most likely reflect atelectasis, similar to previous. No other focal airspace disease. No appreciable pleural effusion. No pulmonary edema.  No pneumothorax. No acute osseus abnormality. IMPRESSION: 1. Shallow lung inflation with associated mild bibasilar atelectasis. 2. No other active cardiopulmonary disease. Electronically Signed   By: Rise Mu M.D.   On: 11/07/2018 03:26   Dg Chest 2 View  Result Date: 11/05/2018 CLINICAL DATA:  Acute onset of central chest pain and pressure. Shortness of breath. EXAM: CHEST - 2 VIEW COMPARISON:  Chest radiograph and CTA of the chest performed 06/02/2016 FINDINGS: The lungs are well-aerated. Mild bibasilar opacities likely  reflect atelectasis. No pleural effusion or pneumothorax is seen. The heart is normal in size; the mediastinal contour is within normal limits. No acute osseous abnormalities are seen. There is mild chronic resorption or resection at the distal right clavicle. IMPRESSION: Mild bibasilar opacities likely reflect atelectasis; lungs otherwise clear. Electronically Signed   By: Roanna Raider M.D.   On: 11/05/2018 23:10    EKG: Independently reviewed.  Sinus tachycardia with rate 103; nonspecific ST changes with some that appear to be new  Labs on Admission: I have personally reviewed the available labs and imaging studies at the time of the admission.  Pertinent labs:   Glucose 147 CMP otherwise WNL Troponin 0.00 WBC 16.0 D-dimer 0.32   Assessment/Plan Principal Problem:   Chest pain Active Problems:   BIPOLAR DISORDER UNSPECIFIED   Essential hypertension   Hyperglycemia   Leukocytosis   Tobacco dependence   Chest pain -Patient with substernal chest pain that has been constant but waxing and waning since Friday. -He has radiation into the jaw and ears -It is not exertional or relieved with rest/NTG -1/3 typical symptoms suggestive of noncardiac chest pain.  -CXR unremarkable.   -Initial cardiac troponin negative on 11/9 (discharged) and again this AM.  -EKG not indicative of acute ischemia although possibly with some new TWI/flattening -HEART score is  4.   -GRACE score is 97; which predicts an in-hospital death rate of 0.7%.  -Will plan to place in observation status on telemetry to rule out ACS by overnight observation.  -cycle troponin q6h x 3 and repeat EKG in AM -Start ASA 81 mg daily -morphine given -Risk factor stratification with HgbA1c and FLP; will also check TSH and UDS -Cardiology consultation  - NPO for possible stress test  -Assuming pain is not cardiac in nature, patient with description that may be c/w GI discomfort, belching on exam.  Will give Protonix 40 mg IV x 1 and start Protonix 40 mg PO BID as well as Carafate qAC and qhs. -He may benefit from outpatient GI evaluation.  HTN -Previously on Toprol XL -Has not been taking medication in some time -Prior h/o HTN may have been associated with ETOH dependence -Current BP is well controlled without medications so will not start BP medication at this time  Hyperglycemia -Glucose 147 -Will check A1c -There is no indication to start medication at this time, but will cover with sensitive-scale SSI for now  Leukocytosis -Uncertain etiology, as he does not have obvious infectious symptoms at this time -May be an acute-phase reactant associated with current physiologic stress -Will trend  Bipolar d/o -patient with h/o ETOH dependence (not reported by patient today, but noticed on admission from 2012) -Also with h/o Bipolar, on multiple medications (Eskalith, Wellbutrin, Risperdal, Seroquel, Trazodone) and admitted for "nervous breakdown with suicidal ideation with thoughts of shooting", his 2nd admission to [facility] as of 12/2004. -He has apparently stopped drinking and does not take medications for this other than Ambien -He does have mildly pressured speech with apparently anxiety and wandering thoughts (but not tangienitality) -He may benefit from outpatient psych evaluation -There may be some component of drug-seeking behavior, as he mentioned the opioid pain medications  multiple times during our encounter -Will check UDS -He reports no longer drinking ETOH and has normal LFTs, but gastritis associated with drinking is also a consideration  Tobacco dependence -Encourage cessation.  This was discussed with the patient and should be reviewed on an ongoing basis.   -Patch ordered at patient request.  DVT prophylaxis:  Lovenox  Code Status:  Full - confirmed with patient Family Communication: None present Disposition Plan:  Home once clinically improved Consults called: Cardiology  Admission status: It is my clinical opinion that referral for OBSERVATION is reasonable and necessary in this patient based on the above information provided. The aforementioned taken together are felt to place the patient at high risk for further clinical deterioration. However it is anticipated that the patient may be medically stable for discharge from the hospital within 24 to 48 hours.    Jonah Blue MD [facility]  If note is complete, please contact covering daytime or nighttime physician. [email or web address] Password [email or web address]  11/07/2018, 8:05 AM

## 2018-11-07 NOTE — ED Notes (Signed)
Pt reports chest and belly pain. Pt states this all started yesterday morning at 0700. Pt seen and evaluated by PCP.

## 2018-11-07 NOTE — ED Provider Notes (Signed)
[facility] EMERGENCY DEPARTMENT Provider Note   CSN: [id number] Arrival date & time: 11/07/18  4098     History   Chief Complaint Chief Complaint  Patient presents with  . Abdominal Pain  . Chest Pain    HPI Mathew Gonzalez is a 52 y.o. male.  52 year old male with past medical history including bipolar disorder, hypertension who presents with chest pain.  Patient states that yesterday morning he began having central chest pain that has been constant since it began, now radiating to his back, left shoulder, and jaws.  The pain is worse with any movements.  He reports some mild associated shortness of breath.  He describes the pain as "grabbing."  He has had associated nausea and belching but no vomiting or diaphoresis. He presented here last night and work up including Ct abd/pelvis was normal. His pain worsened this evening which is what brought him back.  He saw his PCP yesterday morning and was given nitroglycerin to use as needed for chest pain.  He has taken 3 doses of nitroglycerin with no relief of his pain.  He denies any recent travel, leg swelling, history of blood clots, or history of cancer.  He took 325mg  aspirin prior to EMS arrival.   The history is provided by the patient.    Past Medical History:  Diagnosis Date  . Arthritis    osteoarthritis   . Hypertension     Patient Active Problem List   Diagnosis Date Noted  . Chest pain 11/07/2018  . Tachycardia 05/04/2014  . ALLERGIC RHINITIS 08/21/2009  . INSOMNIA 08/21/2009  . HEADACHE, CHRONIC 08/21/2009  . BIPOLAR DISORDER UNSPECIFIED 08/20/2009  . HYPERTENSION 08/20/2009  . UNSPECIFIED FUNCTIONAL DISORDER OF STOMACH 08/20/2009  . ALCOHOL ABUSE, HX OF 08/20/2009  . NEPHROLITHIASIS, HX OF 08/20/2009  . KNEE REPLACEMENT, HX OF 08/20/2009  . Other postprocedural status(V45.89) 08/20/2009    Past Surgical History:  Procedure Laterality Date  . REPLACEMENT TOTAL KNEE    . ROTATOR CUFF  REPAIR          Home Medications    Prior to Admission medications   Medication Sig Start Date End Date Taking? Authorizing Provider  HYDROcodone-acetaminophen (NORCO/VICODIN) 5-325 MG tablet Take 1 tablet by mouth 2 (two) times daily as needed for moderate pain.   Yes [provider]  aspirin EC 81 MG tablet Take 162 mg by mouth daily.    [provider]  cetirizine (ZYRTEC) 10 MG tablet Take 10 mg by mouth daily.    [provider]  ciprofloxacin (CIPRO) 500 MG tablet Take 1 tablet (500 mg total) 2 (two) times daily by mouth. One po bid x 7 days Patient not taking: Reported on 11/05/2018 11/12/17   Charlynne Pander, MD  ibuprofen (ADVIL,MOTRIN) 800 MG tablet Take 1 tablet (800 mg total) by mouth 3 (three) times daily. 08/28/13   Garlon Hatchet, PA-C  methocarbamol (ROBAXIN) 500 MG tablet Take 1 tablet (500 mg total) by mouth 2 (two) times daily as needed. 08/28/13   Garlon Hatchet, PA-C  metoprolol succinate (TOPROL-XL) 25 MG 24 hr tablet Take 1 tablet (25 mg total) by mouth daily. 05/04/14   Robbie Lis M, PA-C  metroNIDAZOLE (FLAGYL) 500 MG tablet Take 1 tablet (500 mg total) 2 (two) times daily by mouth. One po bid x 7 days 11/12/17   Charlynne Pander, MD  minocycline (MINOCIN,DYNACIN) 100 MG capsule Take 100 mg by mouth.    [provider]  naproxen (NAPROSYN) 500 MG tablet Take 1 tablet (500 mg total) by mouth 2 (two) times daily with a meal. 06/02/13   Eber Hong, MD    Family History No family history on file.  Social History Social History   Tobacco Use  . Smoking status: Light Tobacco Smoker    Types: Pipe  . Smokeless tobacco: Never Used  Substance Use Topics  . Alcohol use: Yes  . Drug use: No    Types: Marijuana     Allergies   Prednisone; Ivp dye [iodinated diagnostic agents]; and Metrizamide   Review of Systems Review of Systems All other systems reviewed and are negative except that which was mentioned in  HPI   Physical Exam Updated Vital Signs BP 103/71   Pulse 93   Temp 99.6 F (37.6 C) (Oral)   Resp (!) 21   Ht 5\' 10"  (1.778 m)   Wt 104.3 kg   SpO2 97%   BMI 32.99 kg/m   Physical Exam  Constitutional: He is oriented to person, place, and time. He appears well-developed and well-nourished.  In mild distress due to pain  HENT:  Head: Normocephalic and atraumatic.  Moist mucous membranes  Eyes: Conjunctivae are normal.  Neck: Neck supple.  Cardiovascular: Normal rate, regular rhythm and normal heart sounds.  No murmur heard. Pulmonary/Chest: Effort normal and breath sounds normal. He exhibits no tenderness.  Abdominal: Soft. Bowel sounds are normal. He exhibits no distension. There is no tenderness.  Musculoskeletal: He exhibits no edema.  Neurological: He is alert and oriented to person, place, and time.  Fluent speech  Skin: Skin is warm and dry.  Psychiatric: He has a normal mood and affect. Judgment normal.  Nursing note and vitals reviewed.    ED Treatments / Results  Labs (all labs ordered are listed, but only abnormal results are displayed) Labs Reviewed  COMPREHENSIVE METABOLIC PANEL - Abnormal; Notable for the following components:      Result Value   Glucose, Bld 147 (*)    All other components within normal limits  CBC - Abnormal; Notable for the following components:   WBC 16.0 (*)    HCT 52.9 (*)    All other components within normal limits  LIPASE, BLOOD  D-DIMER, QUANTITATIVE (NOT AT [facility])  I-STAT TROPONIN, ED    EKG EKG Interpretation  Date/Time:  Monday November 07 2018 02:47:02 EST Ventricular Rate:  103 PR Interval:    QRS Duration: 111 QT Interval:  329 QTC Calculation: 431 R Axis:   146 Text Interpretation:  Sinus tachycardia Lateral infarct, acute Minimal ST elevation, inferior leads compared to previous, New T wave inversions w/ Q wave in III, T wave flattening in aVF, other leads are similar to previous Confirmed by Frederick Peers  [phone]) on 11/07/2018 2:50:06 AM   Radiology Ct Abdomen Pelvis Wo Contrast  Result Date: 11/06/2018 CLINICAL DATA:  Acute onset of epigastric abdominal pain. EXAM: CT ABDOMEN AND PELVIS WITHOUT CONTRAST TECHNIQUE: Multidetector CT imaging of the abdomen and pelvis was performed following the standard protocol without IV contrast. COMPARISON:  CT of the abdomen and pelvis performed 11/12/2017 FINDINGS: Lower chest: Bibasilar opacities may reflect atelectasis or possibly mild pneumonia. The visualized portions of the mediastinum are unremarkable. Hepatobiliary: The liver is unremarkable in appearance. The gallbladder is unremarkable in appearance. The common bile duct remains normal in caliber. Pancreas: The pancreas is within normal limits. Spleen: The spleen is unremarkable in appearance. Adrenals/Urinary Tract: The adrenal glands are unremarkable  in appearance. Nonspecific perinephric stranding is noted bilaterally. There is no evidence of hydronephrosis. No renal or ureteral stones are identified. Stomach/Bowel: The stomach is unremarkable in appearance. The small bowel is within normal limits. The appendix is normal in caliber, without evidence of appendicitis. Mild scattered diverticulosis is noted about much of the colon, without evidence of diverticulitis. Vascular/Lymphatic: The abdominal aorta is unremarkable in appearance. The inferior vena cava is grossly unremarkable. No retroperitoneal lymphadenopathy is seen. No pelvic sidewall lymphadenopathy is identified. Reproductive: The bladder is mildly distended and grossly unremarkable. The prostate remains normal in size. Other: A small umbilical hernia is noted, containing only fat. No significant associated inflammation is seen, though this might explain the patient's symptoms. Musculoskeletal: No acute osseous abnormalities are identified. The visualized musculature is unremarkable in appearance. IMPRESSION: 1. Small umbilical hernia, containing only  fat. No significant associated inflammation seen, though this might explain the patient's symptoms. 2. Bibasilar airspace opacities may reflect atelectasis or possibly mild pneumonia. 3. Mild scattered diverticulosis about much of the colon, without evidence of diverticulitis. Electronically Signed   By: Roanna Raider M.D.   On: 11/06/2018 04:03   Dg Chest 2 View  Result Date: 11/07/2018 CLINICAL DATA:  Initial evaluation for acute chest and epigastric pain. EXAM: CHEST - 2 VIEW COMPARISON:  Prior radiograph from 11/05/2018. FINDINGS: Mild cardiomegaly, stable. Mediastinal silhouette normal. Aortic atherosclerosis. Lungs hypoinflated. Mild streaky bibasilar opacities most likely reflect atelectasis, similar to previous. No other focal airspace disease. No appreciable pleural effusion. No pulmonary edema. No pneumothorax. No acute osseus abnormality. IMPRESSION: 1. Shallow lung inflation with associated mild bibasilar atelectasis. 2. No other active cardiopulmonary disease. Electronically Signed   By: Rise Mu M.D.   On: 11/07/2018 03:26   Dg Chest 2 View  Result Date: 11/05/2018 CLINICAL DATA:  Acute onset of central chest pain and pressure. Shortness of breath. EXAM: CHEST - 2 VIEW COMPARISON:  Chest radiograph and CTA of the chest performed 06/02/2016 FINDINGS: The lungs are well-aerated. Mild bibasilar opacities likely reflect atelectasis. No pleural effusion or pneumothorax is seen. The heart is normal in size; the mediastinal contour is within normal limits. No acute osseous abnormalities are seen. There is mild chronic resorption or resection at the distal right clavicle. IMPRESSION: Mild bibasilar opacities likely reflect atelectasis; lungs otherwise clear. Electronically Signed   By: Roanna Raider M.D.   On: 11/05/2018 23:10    Procedures Procedures (including critical care time)  Medications Ordered in ED Medications  fentaNYL (SUBLIMAZE) injection 50 mcg (50 mcg Intravenous  Given 11/07/18 0334)  alum & mag hydroxide-simeth (MAALOX/MYLANTA) 200-200-20 MG/5ML suspension 30 mL (30 mLs Oral Given 11/07/18 0334)  dicyclomine (BENTYL) capsule 10 mg (10 mg Oral Given 11/07/18 0334)     Initial Impression / Assessment and Plan / ED Course  I have reviewed the triage vital signs and the nursing notes.  Pertinent labs & imaging results that were available during my care of the patient were reviewed by me and considered in my medical decision making (see chart for details).    VS normal, no abd tenderness.  Reviewed work-up from yesterday which shows reassuring EKG, negative CT of abdomen and pelvis. Initial EKG shows T wave inversion in III new from yesterday, otherwise similar. EMS concerned about EKG in route and contacted cardiology, Dr. Mayford Knife. I spoke with her about EKG and she advised no indication to activate cath lab.   Labs show negative trop, WBC 16, normal LFTs and lipase.  D-dimer negative  making PE very unlikely.  Considered aortic dissection but feel this is less likely given no widened mediastinum and atypical story.  He has had no significant cough to suggest pneumonia.  Because of ongoing symptoms that are worsening, recommended admission for cardiac work-up.  Discussed with [facility], Dr. Loney Loh, and pt admitted for further evaluation.  Final Clinical Impressions(s) / ED Diagnoses   Final diagnoses:  Chest pain, unspecified type    ED Discharge Orders    None       Little, Ambrose Finland, MD 11/07/18 [phone]

## 2018-11-07 NOTE — Consult Note (Signed)
Cardiology Consultation:   Patient ID: Mathew Gonzalez MRN: [id number]; DOB: September 27, 1966  Admit date: 11/07/2018 Date of Consult: 11/07/2018  Primary Care Provider: Salli Real, MD Primary Cardiologist: No primary care provider on file.  Dr. Rennis Golden  Primary Electrophysiologist:  None    Patient Profile:   Mathew Gonzalez is a 52 y.o. male with a hx of hypertension and bipolar disorder who is being seen today for the evaluation of chest discomfort at the request of Dr. Kevan Ny.  History of Present Illness:   Mathew Gonzalez is a 52 year old male with essential hypertension, bipolar disorder here with complaints of substernal chest pain with discomfort in his ear.  In review of the admission history and physical, he did feel as though eating something may make it worse.  It may be worse with moving around.  He stated that if you move subtly that it will start grabbing on you.  Nothing seems to make it better.  The pain medicine may have helped.  Started on Friday and has not really gone away.  Radiated to his jaw and ear.  Dr. Ophelia Charter looked in his ear.  Unremarkable.  He has been to the ER recently discharged.  Troponin thus far has been negative.  It is reported that he smokes every day, pipe.  Marijuana.  His father had cancer of the lung at age 71.  Otherwise no early family history of CAD.  Past Medical History:  Diagnosis Date  . Arthritis    osteoarthritis, on chronic hydrocodone   . Bipolar disorder (HCC)   . Hypertension    not on medication    Past Surgical History:  Procedure Laterality Date  . REPLACEMENT TOTAL KNEE    . ROTATOR CUFF REPAIR       Home Medications:  Prior to Admission medications   Medication Sig Start Date End Date Taking? Authorizing Provider  HYDROcodone-acetaminophen (NORCO/VICODIN) 5-325 MG tablet Take 1 tablet by mouth 2 (two) times daily as needed for moderate pain.   Yes [provider]  testosterone cypionate (DEPOTESTOTERONE CYPIONATE) 100  MG/ML injection Inject 200 mg into the muscle every 14 (fourteen) days. For IM use only   Yes [provider]  zolpidem (AMBIEN) 10 MG tablet Take 5 mg by mouth at bedtime as needed for sleep.   Yes [provider]    Inpatient Medications: Scheduled Meds: . aspirin EC  81 mg Oral Daily  . enoxaparin (LOVENOX) injection  40 mg Subcutaneous Daily  . insulin aspart  0-9 Units Subcutaneous TID WC  . nicotine  14 mg Transdermal Daily  . pantoprazole  40 mg Oral BID  . pantoprazole  40 mg Intravenous Once  . sucralfate  1 g Oral TID WC & HS   Continuous Infusions:  PRN Meds: acetaminophen, HYDROcodone-acetaminophen, morphine injection, ondansetron (ZOFRAN) IV, zolpidem  Allergies:    Allergies  Allergen Reactions  . Prednisone Anxiety and Other (See Comments)    Hallucinations, also  . Ivp Dye [Iodinated Diagnostic Agents] Itching  . Metrizamide Itching    (Metrizamide is a non-ionic, iodine-based, radiocontrast agent)    Social History:   Social History   Socioeconomic History  . Marital status: Single    Spouse name: Not on file  . Number of children: Not on file  . Years of education: Not on file  . Highest education level: Not on file  Occupational History  . Occupation: Control and instrumentation engineer  Social Needs  . Financial resource strain: Not on file  .  Food insecurity:    Worry: Not on file    Inability: Not on file  . Transportation needs:    Medical: Not on file    Non-medical: Not on file  Tobacco Use  . Smoking status: Current Every Day Smoker    Types: Pipe  . Smokeless tobacco: Never Used  Substance and Sexual Activity  . Alcohol use: Not Currently  . Drug use: No    Types: Marijuana    Comment: remote history  . Sexual activity: Not on file  Lifestyle  . Physical activity:    Days per week: Not on file    Minutes per session: Not on file  . Stress: Not on file  Relationships  . Social connections:    Talks on phone: Not on file     Gets together: Not on file    Attends religious service: Not on file    Active member of club or organization: Not on file    Attends meetings of clubs or organizations: Not on file    Relationship status: Not on file  . Intimate partner violence:    Fear of current or ex partner: Not on file    Emotionally abused: Not on file    Physically abused: Not on file    Forced sexual activity: Not on file  Other Topics Concern  . Not on file  Social History Narrative  . Not on file    Family History:    Family History  Problem Relation Age of Onset  . Cancer - Lung Father 31  . CAD Neg Hx      ROS:  Please see the history of present illness.  Denies any fevers chills nausea vomiting syncope bleeding All other ROS reviewed and negative.     Physical Exam/Data:   Vitals:   11/07/18 0600 11/07/18 0630 11/07/18 0700 11/07/18 0805  BP: 103/71 113/73 123/72 117/80  Pulse: 93 92 93 92  Resp: (!) 21 (!) 22 19 (!) 22  Temp:    98.5 F (36.9 C)  TempSrc:    Oral  SpO2: 97% 96% 97% 96%  Weight:      Height:       No intake or output data in the 24 hours ending 11/07/18 0812 Filed Weights   11/07/18 0253  Weight: 104.3 kg   Body mass index is 32.99 kg/m.  General:  Well nourished, well developed, in no acute distress, mildly anxious, family member at bedside HEENT: normal Lymph: no adenopathy Neck: no JVD Endocrine:  No thryomegaly Vascular: No carotid bruits Cardiac:  normal S1, S2; RRR; no murmur  Lungs:  clear to auscultation bilaterally, no wheezing, rhonchi or rales  Abd: soft, nontender, no hepatomegaly  Ext: no edema Musculoskeletal:  No deformities, BUE and BLE strength normal and equal Skin: warm and dry  Neuro:  CNs 2-12 intact, no focal abnormalities noted Psych:  Normal affect   EKG:  The EKG was personally reviewed and demonstrates: Right bundle branch block mildly tachycardic, no other significant abnormality's.  No change from prior personally  reviewed.  Telemetry:  Telemetry was personally reviewed and demonstrates: No adverse arrhythmias.  Heart rate currently 93.  Relevant CV Studies: Prior nuclear stress test in 2015 was low risk with no ischemia-Dr. Hilty.  Laboratory Data:  Chemistry Recent Labs  Lab 11/05/18 2228 11/07/18 0305  NA 136 136  K 4.5 3.9  CL 102 100  CO2 30 28  GLUCOSE 104* 147*  BUN 19 16  CREATININE 1.10 1.09  CALCIUM 8.9 9.0  GFRNONAA >60 >60  GFRAA >60 >60  ANIONGAP 4* 8    Recent Labs  Lab 11/05/18 2228 11/07/18 0305  PROT 6.5 6.7  ALBUMIN 3.6 3.7  AST 20 20  ALT 25 22  ALKPHOS 62 70  BILITOT 0.3 1.1   Hematology Recent Labs  Lab 11/05/18 2228 11/07/18 0305  WBC 13.3* 16.0*  RBC 5.88* 5.76  HGB 17.5* 17.0  HCT 54.3* 52.9*  MCV 92.3 91.8  MCH 29.8 29.5  MCHC 32.2 32.1  RDW 13.2 13.2  PLT 202 207   Cardiac EnzymesNo results for input(s): TROPONINI in the last 168 hours.  Recent Labs  Lab 11/05/18 2231 11/07/18 0339  TROPIPOC 0.00 0.00    BNPNo results for input(s): BNP, PROBNP in the last 168 hours.  DDimer  Recent Labs  Lab 11/07/18 0219  DDIMER 0.32    Radiology/Studies:  Ct Abdomen Pelvis Wo Contrast  Result Date: 11/06/2018 CLINICAL DATA:  Acute onset of epigastric abdominal pain. EXAM: CT ABDOMEN AND PELVIS WITHOUT CONTRAST TECHNIQUE: Multidetector CT imaging of the abdomen and pelvis was performed following the standard protocol without IV contrast. COMPARISON:  CT of the abdomen and pelvis performed 11/12/2017 FINDINGS: Lower chest: Bibasilar opacities may reflect atelectasis or possibly mild pneumonia. The visualized portions of the mediastinum are unremarkable. Hepatobiliary: The liver is unremarkable in appearance. The gallbladder is unremarkable in appearance. The common bile duct remains normal in caliber. Pancreas: The pancreas is within normal limits. Spleen: The spleen is unremarkable in appearance. Adrenals/Urinary Tract: The adrenal glands are  unremarkable in appearance. Nonspecific perinephric stranding is noted bilaterally. There is no evidence of hydronephrosis. No renal or ureteral stones are identified. Stomach/Bowel: The stomach is unremarkable in appearance. The small bowel is within normal limits. The appendix is normal in caliber, without evidence of appendicitis. Mild scattered diverticulosis is noted about much of the colon, without evidence of diverticulitis. Vascular/Lymphatic: The abdominal aorta is unremarkable in appearance. The inferior vena cava is grossly unremarkable. No retroperitoneal lymphadenopathy is seen. No pelvic sidewall lymphadenopathy is identified. Reproductive: The bladder is mildly distended and grossly unremarkable. The prostate remains normal in size. Other: A small umbilical hernia is noted, containing only fat. No significant associated inflammation is seen, though this might explain the patient's symptoms. Musculoskeletal: No acute osseous abnormalities are identified. The visualized musculature is unremarkable in appearance. IMPRESSION: 1. Small umbilical hernia, containing only fat. No significant associated inflammation seen, though this might explain the patient's symptoms. 2. Bibasilar airspace opacities may reflect atelectasis or possibly mild pneumonia. 3. Mild scattered diverticulosis about much of the colon, without evidence of diverticulitis. Electronically Signed   By: Roanna Raider M.D.   On: 11/06/2018 04:03   Dg Chest 2 View  Result Date: 11/07/2018 CLINICAL DATA:  Initial evaluation for acute chest and epigastric pain. EXAM: CHEST - 2 VIEW COMPARISON:  Prior radiograph from 11/05/2018. FINDINGS: Mild cardiomegaly, stable. Mediastinal silhouette normal. Aortic atherosclerosis. Lungs hypoinflated. Mild streaky bibasilar opacities most likely reflect atelectasis, similar to previous. No other focal airspace disease. No appreciable pleural effusion. No pulmonary edema. No pneumothorax. No acute  osseus abnormality. IMPRESSION: 1. Shallow lung inflation with associated mild bibasilar atelectasis. 2. No other active cardiopulmonary disease. Electronically Signed   By: Rise Mu M.D.   On: 11/07/2018 03:26   Dg Chest 2 View  Result Date: 11/05/2018 CLINICAL DATA:  Acute onset of central chest pain and pressure. Shortness of breath. EXAM: CHEST - 2  VIEW COMPARISON:  Chest radiograph and CTA of the chest performed 06/02/2016 FINDINGS: The lungs are well-aerated. Mild bibasilar opacities likely reflect atelectasis. No pleural effusion or pneumothorax is seen. The heart is normal in size; the mediastinal contour is within normal limits. No acute osseous abnormalities are seen. There is mild chronic resorption or resection at the distal right clavicle. IMPRESSION: Mild bibasilar opacities likely reflect atelectasis; lungs otherwise clear. Electronically Signed   By: Roanna Raider M.D.   On: 11/05/2018 23:10    Assessment and Plan:   Atypical chest pain - Troponin negative.  Right bundle branch block seen on ECG with no significant changes from prior.  He does smoke.  No early family history of CAD.  Anxious. He remains n.p.o.  Aortic atherosclerosis was noted on prior chest x-ray.  Does look like he has an IVP dye allergy.  We will go ahead and premedicate him for a coronary CT according to protocol.  He should be able to tolerate short course of prednisone.  Talked with him about this. - If coronary CT is unremarkable, proceed with further GI evaluation.  Mild leukocytosis is also noted 16.  Protonix has been started.   Bipolar disorder - Anxiety present.  May be contributing somewhat to his discomfort.  Tobacco use - Encourage cessation.  Hemoglobin elevated likely secondary to tobacco use.  Essential hypertension - Has not been taking medication.  Thought to be in association with alcohol in the past.  Used to be on Toprol.  We are going to give him Lopressor 50 mg p.o. twice  daily to help reduce his heart rate for CT scan.  Aortic atherosclerosis - Given this process, would encourage use of statin therapy for plaque stabilization.  Obesity -BMI 33, continue to encourage weight loss.  For questions or updates, please contact [facility] Please consult [email or web address] for contact info under     Signed, Donato Schultz, MD  11/07/2018 8:12 AM

## 2018-11-07 NOTE — Progress Notes (Signed)
Asked to assist with cardiac CT planning and pre-medication orders. Dr. Anne Fu prefers 13-hour pre-med protocol, and recommends to start Lopressor 50mg  BID to bring HR down. Pt to complete rule-out today. Diet was written. Dr. Shirlee Latch agreed to read.  I spoke with CT department who went ahead and scheduled study in the next available slot tomorrow which is 4:30pm. This will allow pharmacy and nursing to dose the pre-medication as ordered.  Dayna Dunn PA-C

## 2018-11-08 ENCOUNTER — Observation Stay (HOSPITAL_COMMUNITY): Payer: BLUE CROSS/BLUE SHIELD

## 2018-11-08 ENCOUNTER — Observation Stay (HOSPITAL_BASED_OUTPATIENT_CLINIC_OR_DEPARTMENT_OTHER): Payer: BLUE CROSS/BLUE SHIELD

## 2018-11-08 DIAGNOSIS — D72828 Other elevated white blood cell count: Secondary | ICD-10-CM

## 2018-11-08 DIAGNOSIS — R0789 Other chest pain: Secondary | ICD-10-CM | POA: Diagnosis not present

## 2018-11-08 DIAGNOSIS — R079 Chest pain, unspecified: Secondary | ICD-10-CM | POA: Diagnosis not present

## 2018-11-08 DIAGNOSIS — R1310 Dysphagia, unspecified: Secondary | ICD-10-CM

## 2018-11-08 DIAGNOSIS — I10 Essential (primary) hypertension: Secondary | ICD-10-CM | POA: Diagnosis not present

## 2018-11-08 DIAGNOSIS — F17200 Nicotine dependence, unspecified, uncomplicated: Secondary | ICD-10-CM | POA: Diagnosis not present

## 2018-11-08 LAB — CBC
HEMATOCRIT: 51.2 % (ref 39.0–52.0)
Hemoglobin: 17.1 g/dL — ABNORMAL HIGH (ref 13.0–17.0)
MCH: 29.9 pg (ref 26.0–34.0)
MCHC: 33.4 g/dL (ref 30.0–36.0)
MCV: 89.5 fL (ref 80.0–100.0)
Platelets: 241 10*3/uL (ref 150–400)
RBC: 5.72 MIL/uL (ref 4.22–5.81)
RDW: 13.1 % (ref 11.5–15.5)
WBC: 19.8 10*3/uL — AB (ref 4.0–10.5)
nRBC: 0 % (ref 0.0–0.2)

## 2018-11-08 LAB — URINALYSIS, ROUTINE W REFLEX MICROSCOPIC
Bilirubin Urine: NEGATIVE
Glucose, UA: NEGATIVE mg/dL
Ketones, ur: 5 mg/dL — AB
Leukocytes, UA: NEGATIVE
Nitrite: NEGATIVE
PH: 6 (ref 5.0–8.0)
Protein, ur: 30 mg/dL — AB
SPECIFIC GRAVITY, URINE: 1.033 — AB (ref 1.005–1.030)

## 2018-11-08 LAB — BASIC METABOLIC PANEL
Anion gap: 8 (ref 5–15)
BUN: 22 mg/dL — ABNORMAL HIGH (ref 6–20)
CALCIUM: 9 mg/dL (ref 8.9–10.3)
CO2: 22 mmol/L (ref 22–32)
CREATININE: 1.07 mg/dL (ref 0.61–1.24)
Chloride: 102 mmol/L (ref 98–111)
GFR calc Af Amer: >60 mL/min (ref >60)
GFR calc non Af Amer: >60 mL/min (ref >60)
GLUCOSE: 130 mg/dL — AB (ref 70–99)
Potassium: 4.3 mmol/L (ref 3.5–5.1)
Sodium: 132 mmol/L — ABNORMAL LOW (ref 135–145)

## 2018-11-08 LAB — SEDIMENTATION RATE: SED RATE: 34 mm/h — AB (ref 0–16)

## 2018-11-08 LAB — ECHOCARDIOGRAM COMPLETE
HEIGHTINCHES: 70 in
WEIGHTICAEL: 3679.04 [oz_av]

## 2018-11-08 LAB — GLUCOSE, CAPILLARY
GLUCOSE-CAPILLARY: 126 mg/dL — AB (ref 70–99)
Glucose-Capillary: 117 mg/dL — ABNORMAL HIGH (ref 70–99)
Glucose-Capillary: 116 mg/dL — ABNORMAL HIGH (ref 70–99)

## 2018-11-08 LAB — C-REACTIVE PROTEIN: CRP: 25.3 mg/dL — ABNORMAL HIGH (ref <1.0)

## 2018-11-08 LAB — PROCALCITONIN: PROCALCITONIN: 0.25 ng/mL

## 2018-11-08 MED ORDER — SODIUM CHLORIDE 0.9 % IV SOLN
500.0000 mg | INTRAVENOUS | Status: DC
  Administered 2018-11-08 – 2018-11-09 (×2): 500 mg via INTRAVENOUS
  Filled 2018-11-08 (×2): qty 500

## 2018-11-08 MED ORDER — SODIUM CHLORIDE 0.9 % IV SOLN
INTRAVENOUS | Status: DC
  Administered 2018-11-08: 11:00:00 via INTRAVENOUS

## 2018-11-08 MED ORDER — SODIUM CHLORIDE 0.9 % IV SOLN
1.0000 g | INTRAVENOUS | Status: DC
  Administered 2018-11-08 – 2018-11-09 (×2): 1 g via INTRAVENOUS
  Filled 2018-11-08 (×2): qty 10

## 2018-11-08 MED ORDER — METOPROLOL SUCCINATE ER 50 MG PO TB24
50.0000 mg | ORAL_TABLET | Freq: Every day | ORAL | Status: DC
  Administered 2018-11-08 – 2018-11-09 (×2): 50 mg via ORAL
  Filled 2018-11-08 (×2): qty 1

## 2018-11-08 MED ORDER — IBUPROFEN 600 MG PO TABS
600.0000 mg | ORAL_TABLET | Freq: Three times a day (TID) | ORAL | Status: DC
  Administered 2018-11-08 – 2018-11-09 (×3): 600 mg via ORAL
  Filled 2018-11-08 (×2): qty 1
  Filled 2018-11-08 (×2): qty 3
  Filled 2018-11-08 (×2): qty 1
  Filled 2018-11-08: qty 3

## 2018-11-08 MED ORDER — COLCHICINE 0.6 MG PO TABS
0.6000 mg | ORAL_TABLET | Freq: Two times a day (BID) | ORAL | Status: DC
  Administered 2018-11-08 – 2018-11-09 (×2): 0.6 mg via ORAL
  Filled 2018-11-08 (×3): qty 1

## 2018-11-08 MED ORDER — IBUPROFEN 400 MG PO TABS: 600.0000 mg | ORAL_TABLET | Freq: Four times a day (QID) | ORAL | Status: DC

## 2018-11-08 MED ORDER — SODIUM CHLORIDE 0.9 % IV SOLN
INTRAVENOUS | Status: AC
  Administered 2018-11-08: 12:00:00 via INTRAVENOUS

## 2018-11-08 NOTE — Progress Notes (Addendum)
Progress Note  Patient Name: Mathew Gonzalez Date of Encounter: 11/08/2018  Primary Cardiologist: Pixie Casino, MD  Subjective   He states he has been unable to eat or drink well since Saturday as doing so causes upper chest discomfort. This is especially worse by cold fluids. Symptoms are worse lying back. He mounted a temp overnight of 100.4. No SOB. + mild upper epigastric tendernesss.  Inpatient Medications    Scheduled Meds: . aspirin EC  81 mg Oral Daily  . diphenhydrAMINE  50 mg Oral Once   Or  . diphenhydrAMINE  50 mg Intravenous Once  . enoxaparin (LOVENOX) injection  40 mg Subcutaneous Daily  . insulin aspart  0-9 Units Subcutaneous TID WC  . metoprolol tartrate  50 mg Oral BID  . nicotine  14 mg Transdermal Daily  . pantoprazole  40 mg Oral BID  . predniSONE  50 mg Oral Q6H  . sucralfate  1 g Oral TID WC & HS   Continuous Infusions:  PRN Meds: acetaminophen, HYDROcodone-acetaminophen, morphine injection, ondansetron (ZOFRAN) IV, zolpidem   Vital Signs    Vitals:   11/07/18 2140 11/07/18 2222 11/08/18 0255 11/08/18 0545  BP:  115/75 107/70 126/86  Pulse: 87 86 92 98  Resp:  '18 19 19  '$ Temp: 99.5 F (37.5 C) 98.5 F (36.9 C) 99.7 F (37.6 C) 100 F (37.8 C)  TempSrc: Oral Oral Oral Oral  SpO2:  97% 95% 93%  Weight:      Height:        Intake/Output Summary (Last 24 hours) at 11/08/2018 0842 Last data filed at 11/08/2018 0700 Gross per 24 hour  Intake -  Output 550 ml  Net -550 ml   Filed Weights   11/07/18 0253  Weight: 104.3 kg    Telemetry    Sinus tach - Personally Reviewed  ECG    NSR IRBBB early upsloping diffusely more pronounced inferiorly - Personally Reviewed with Dr. Marlou Porch. Not felt to represent acute MI.  Physical Exam   GEN: No acute distress.  HEENT: Normocephalic, atraumatic, sclera non-icteric. Neck: No JVD or bruits. Cardiac: Tachycardic, regular, no murmurs, rubs, or gallops.  Radials/DP/PT 1+ and equal  bilaterally.  Respiratory: Clear to auscultation bilaterally. Breathing is unlabored. GI: Soft, non-distended, BS +x 4. + mild epigatric tenderness MS: no deformity. Extremities: No clubbing or cyanosis. No edema. Distal pedal pulses are 2+ and equal bilaterally. Neuro:  AAOx3. Follows commands. Psych:  Responds to questions appropriately with a normal affect.  Labs    Chemistry Recent Labs  Lab 11/05/18 2228 11/07/18 0305  NA 136 136  K 4.5 3.9  CL 102 100  CO2 30 28  GLUCOSE 104* 147*  BUN 19 16  CREATININE 1.10 1.09  CALCIUM 8.9 9.0  PROT 6.5 6.7  ALBUMIN 3.6 3.7  AST 20 20  ALT 25 22  ALKPHOS 62 70  BILITOT 0.3 1.1  GFRNONAA >60 >60  GFRAA >60 >60  ANIONGAP 4* 8     Hematology Recent Labs  Lab 11/05/18 2228 11/07/18 0305  WBC 13.3* 16.0*  RBC 5.88* 5.76  HGB 17.5* 17.0  HCT 54.3* 52.9*  MCV 92.3 91.8  MCH 29.8 29.5  MCHC 32.2 32.1  RDW 13.2 13.2  PLT 202 207    Cardiac Enzymes Recent Labs  Lab 11/07/18 0811 11/07/18 1442 11/07/18 1950  TROPONINI <0.03 <0.03 <0.03    Recent Labs  Lab 11/05/18 2231 11/07/18 0339  TROPIPOC 0.00 0.00  BNPNo results for input(s): BNP, PROBNP in the last 168 hours.   DDimer  Recent Labs  Lab 11/07/18 0219  DDIMER 0.32     Radiology    Dg Chest 2 View  Result Date: 11/07/2018 CLINICAL DATA:  Initial evaluation for acute chest and epigastric pain. EXAM: CHEST - 2 VIEW COMPARISON:  Prior radiograph from 11/05/2018. FINDINGS: Mild cardiomegaly, stable. Mediastinal silhouette normal. Aortic atherosclerosis. Lungs hypoinflated. Mild streaky bibasilar opacities most likely reflect atelectasis, similar to previous. No other focal airspace disease. No appreciable pleural effusion. No pulmonary edema. No pneumothorax. No acute osseus abnormality. IMPRESSION: 1. Shallow lung inflation with associated mild bibasilar atelectasis. 2. No other active cardiopulmonary disease. Electronically Signed   By: Jeannine Boga M.D.   On: 11/07/2018 03:26    Cardiac Studies   Pending  Patient Profile     52 y.o. male with HTN, bipolar d/o (not on meds), tobacco use, chronic pain (on pain contract), prior ETOH who was recently seen in ER 11/9 with epigastric/sharp chest pain and leukocytosis. CT abd pelvis showed lower portions of mediastinum normal, +small umbilical hernia containing only fat, bibasilar opacities may reflect atelectasis or mild PNA, mild scattered diverticulosis. DC home but returned yesterday with again constant substernal chest pain - radiating into his jaw and ears. Also noted to have worsening leukocytosis and febrile to 100.4.  Assessment & Plan    1. Chest pain - symptoms are very atypical; despite several days of constant pain, troponins are negative. Initially cardiac CT has been planned with pre-medication for contrast allergy with prednisone but I am concerned even with beta blockade he may not be able to get HR down. He currently meets SIRS criteria (leukocytosis, fever, tachycardia), there is likely an additional process at play here unifying both abdominal symptoms and chest symptoms. I will clarify plan for coronary CT with Dr. Marlou Porch but in the meantime also recheck labs to trend. Add UA, ESR, CRP and blood cultures. Will defer further infective workup to IM. CT from last ER visit raised question of PNA. I feel symptoms are more consistent with a gastrointestinal process. He does have increased discomfort when lying back but if there were a pericardial process, would expect that the prednisone used for pre-medication would have provided some relief. Per d/w Dr. Marlou Porch, will cancel CT and prednisone. I cannot cancel the CT order in Epic so have asked nursing to call CT dept. Will obtain echo given tachycardia.  2. HTN - blood pressure is controlled even prior to addition of Lopressor '50mg'$  BID which is being used for sinus tach to block HR down. However, this does not seem  particularly effective thus far. BP tends to be low-normal. Will change Lopressor to Toprol '50mg'$  daily. He used to be on beta blocker.  3. Dark urine - pt reports drinking very little as cold liquids exacerbate symptoms. Will start IV fluids. Check UA.  4. Tobacco abuse - counseled on cessation.  For questions or updates, please contact [place] Please consult [email or web address] for contact info under Cardiology/STEMI.  Signed, Charlie Pitter, PA-C 11/08/2018, 8:42 AM    Personally seen and examined. Agree with above.  Chest pain/tobacco use-encourage cessation/epigastric discomfort/leukocytosis/fever Given his fever of 100.4, symptoms of constant-like chest discomfort perhaps worsened with ingestion of food or drink we will hold off on CT scan of coronary arteries.  Troponins were all normal.  On exam, I do not hear a rub.  He is up and about in his room  but still feeling this continuous epigastric discomfort.  No specific cough.  Leukocytosis was noted on admission.  This has increased, partly from recent prednisone use in anticipation of IV dye allergy for CT scan.  An inflammatory syndrome such as pleuritis or pericarditis could be a possibility however I would have thought that after a few doses of prednisone that he got yesterday that this would have helped if this were an inflammatory pericarditis.  His ECG does show accentuation of J-point elevation in the inferior leads.  With his normal cardiac markers, this is not acute coronary syndrome.  There is been no real change in his symptoms since he has been here.  I have discussed with Dr. Eliseo Squires.  An echocardiogram will be ordered.  Prior stress test in 2015 was low risk, normal.  It would not be unreasonable to continue further evaluation as an outpatient.  Question if he needs a further GI evaluation?  We will go ahead and sign off.  Please let us know if we can be of further assistance.  Candee Furbish, MD

## 2018-11-08 NOTE — Progress Notes (Signed)
  Echocardiogram 2D Echocardiogram has been performed.  Mathew Gonzalez G [name] 11/08/2018, 2:14 PM

## 2018-11-08 NOTE — Progress Notes (Signed)
Small pericardial effusion-- trial of ibuprofen and colchicine. PPI as well for GI protection

## 2018-11-08 NOTE — Progress Notes (Signed)
Progress Note    EMET RAFANAN  CBS:[id number] DOB: 03/01/1966  DOA: 11/07/2018 PCP: Sandi Mariscal, MD    Brief Narrative:    Medical records reviewed and are as summarized below:  Mathew Gonzalez is an 52 y.o. male with essential hypertension, bipolar disorder here with complaints of substernal chest pain, pleurtic chest pain as well as  Assessment/Plan:   Principal Problem:   Chest pain Active Problems:   BIPOLAR DISORDER UNSPECIFIED   Essential hypertension   Hyperglycemia   Leukocytosis   Tobacco dependence  Fever -? Etiology -treat for CAP as infiltrates seen on CT abd -ESR/CPR elevated  Chest pain -pain worse with laying flat, better siting up-- no rub-- echo pending -appreciate cardiology eval- holding further evaluation light of fever  Dehydration -IVF  Dysphagia/dynaphagia -hold on GI consult -check DG esophagus  HTN -Previously on Toprol XL -Has not been taking medication in some time -Prior h/o HTN may have been associated with ETOH dependence -Current BP is well controlled without medications so will not start BP medication at this time  Hyperglycemia -HgbA1c: 5.2 -d/c SSI  Leukocytosis -trending up -monitor  Bipolar d/o -patient with h/o ETOH dependence (not reported by patient today, but noticed on admission from 2012) -Also with h/o Bipolar, on multiple medications (Eskalith, Wellbutrin, Risperdal, Seroquel, Trazodone) and admitted for "nervous breakdown with suicidal ideation with thoughts of shooting", his 2nd admission to [facility] as of 12/2004. -He has apparently stopped drinking and does not take medications for this other than Ambien -He does have mildly pressured speech with apparently anxiety and wandering thoughts (but not tangienitality) -He may benefit from outpatient psych evaluation  Tobacco dependence -encouraged cessation  obesity Body mass index is 32.99 kg/m.   Family Communication/Anticipated D/C date and plan/Code  Status   DVT prophylaxis: Lovenox ordered. Code Status: Full Code.  Family Communication:  Disposition Plan:    Medical Consultants:    cards     Subjective:   Hurts when he swallows or lays flat  Objective:    Vitals:   11/08/18 0255 11/08/18 0545 11/08/18 1047 11/08/18 1209  BP: 107/70 126/86 118/77 116/87  Pulse: 92 98 95 90  Resp: '19 19  16  '$ Temp: 99.7 F (37.6 C) 100 F (37.8 C)  100 F (37.8 C)  TempSrc: Oral Oral  Oral  SpO2: 95% 93%  97%  Weight:      Height:        Intake/Output Summary (Last 24 hours) at 11/08/2018 1413 Last data filed at 11/08/2018 0700 Gross per 24 hour  Intake -  Output 300 ml  Net -300 ml   Filed Weights   11/07/18 0253  Weight: 104.3 kg    Exam: In chair- anxious appearing Diminished at bases right >left  rrr Skin dry- tents No LE edema +Bs,soft NT  Data Reviewed:   I have personally reviewed following labs and imaging studies:  Labs: Labs show the following:   Basic Metabolic Panel: Recent Labs  Lab 11/05/18 2228 11/07/18 0305 11/08/18 0841  NA 136 136 132*  K 4.5 3.9 4.3  CL 102 100 102  CO2 '30 28 22  '$ GLUCOSE 104* 147* 130*  BUN 19 16 22*  CREATININE 1.10 1.09 1.07  CALCIUM 8.9 9.0 9.0   GFR Estimated Creatinine Clearance: 97.7 mL/min (by C-G formula based on SCr of 1.07 mg/dL). Liver Function Tests: Recent Labs  Lab 11/05/18 2228 11/07/18 0305  AST 20 20  ALT 25 22  ALKPHOS  62 70  BILITOT 0.3 1.1  PROT 6.5 6.7  ALBUMIN 3.6 3.7   Recent Labs  Lab 11/07/18 0305  LIPASE 38   No results for input(s): AMMONIA in the last 168 hours. Coagulation profile No results for input(s): INR, PROTIME in the last 168 hours.  CBC: Recent Labs  Lab 11/05/18 2228 11/07/18 0305 11/08/18 0841  WBC 13.3* 16.0* 19.8*  NEUTROABS 9.3*  --   --   HGB 17.5* 17.0 17.1*  HCT 54.3* 52.9* 51.2  MCV 92.3 91.8 89.5  PLT 202 207 241   Cardiac Enzymes: Recent Labs  Lab 11/07/18 0811 11/07/18 1442  11/07/18 1950  TROPONINI <0.03 <0.03 <0.03   BNP (last 3 results) No results for input(s): PROBNP in the last 8760 hours. CBG: Recent Labs  Lab 11/07/18 1648 11/07/18 2225 11/08/18 0551 11/08/18 0739 11/08/18 1147  GLUCAP 106* 110* 126* 117* 116*   D-Dimer: Recent Labs    11/07/18 0219  DDIMER 0.32   Hgb A1c: Recent Labs    11/07/18 0811  HGBA1C 5.2   Lipid Profile: Recent Labs    11/07/18 0811  CHOL 143  HDL 46  LDLCALC 90  TRIG 34  CHOLHDL 3.1   Thyroid function studies: Recent Labs    11/07/18 0811  TSH 1.335   Anemia work up: No results for input(s): VITAMINB12, FOLATE, FERRITIN, TIBC, IRON, RETICCTPCT in the last 72 hours. Sepsis Labs: Recent Labs  Lab 11/05/18 2228 11/07/18 0305 11/08/18 0841 11/08/18 1015  PROCALCITON  --   --   --  0.25  WBC 13.3* 16.0* 19.8*  --     Microbiology No results found for this or any previous visit (from the past 240 hour(s)).  Procedures and diagnostic studies:  Dg Chest 2 View  Result Date: 11/07/2018 CLINICAL DATA:  Initial evaluation for acute chest and epigastric pain. EXAM: CHEST - 2 VIEW COMPARISON:  Prior radiograph from 11/05/2018. FINDINGS: Mild cardiomegaly, stable. Mediastinal silhouette normal. Aortic atherosclerosis. Lungs hypoinflated. Mild streaky bibasilar opacities most likely reflect atelectasis, similar to previous. No other focal airspace disease. No appreciable pleural effusion. No pulmonary edema. No pneumothorax. No acute osseus abnormality. IMPRESSION: 1. Shallow lung inflation with associated mild bibasilar atelectasis. 2. No other active cardiopulmonary disease. Electronically Signed   By: Jeannine Boga M.D.   On: 11/07/2018 03:26    Medications:   . aspirin EC  81 mg Oral Daily  . enoxaparin (LOVENOX) injection  40 mg Subcutaneous Daily  . insulin aspart  0-9 Units Subcutaneous TID WC  . metoprolol succinate  50 mg Oral Daily  . nicotine  14 mg Transdermal Daily  .  pantoprazole  40 mg Oral BID   Continuous Infusions: . sodium chloride 150 mL/hr at 11/08/18 1157  . azithromycin 500 mg (11/08/18 1144)  . cefTRIAXone (ROCEPHIN)  IV 1 g (11/08/18 1045)     LOS: 0 days   Geradine Girt  [facility]   *Please refer to [email or web address], password [email or web address] to get updated schedule on who will round on this patient, as hospitalists switch teams weekly. If 7PM-7AM, please contact night-coverage at [email or web address], password [email or web address] for any overnight needs.  11/08/2018, 2:13 PM

## 2018-11-08 NOTE — Progress Notes (Signed)
Pt temp. fluctuating . Temp 100F. Provider been aware of temp. Since previous shift. Temp closely monitored. Pt shows no other symptoms of infection. Pt urine dark, amber. Pt requested IV fluids. Provider to be aware during rounding.

## 2018-11-09 DIAGNOSIS — F17200 Nicotine dependence, unspecified, uncomplicated: Secondary | ICD-10-CM

## 2018-11-09 DIAGNOSIS — F3170 Bipolar disorder, currently in remission, most recent episode unspecified: Secondary | ICD-10-CM

## 2018-11-09 DIAGNOSIS — I10 Essential (primary) hypertension: Secondary | ICD-10-CM

## 2018-11-09 DIAGNOSIS — J189 Pneumonia, unspecified organism: Secondary | ICD-10-CM

## 2018-11-09 DIAGNOSIS — R1312 Dysphagia, oropharyngeal phase: Secondary | ICD-10-CM

## 2018-11-09 DIAGNOSIS — R0789 Other chest pain: Secondary | ICD-10-CM

## 2018-11-09 DIAGNOSIS — R739 Hyperglycemia, unspecified: Secondary | ICD-10-CM

## 2018-11-09 LAB — COMPREHENSIVE METABOLIC PANEL
ALK PHOS: 57 U/L (ref 38–126)
ALT: 25 U/L (ref 0–44)
ANION GAP: 6 (ref 5–15)
AST: 16 U/L (ref 15–41)
Albumin: 2.7 g/dL — ABNORMAL LOW (ref 3.5–5.0)
BUN: 27 mg/dL — ABNORMAL HIGH (ref 6–20)
CALCIUM: 8.1 mg/dL — AB (ref 8.9–10.3)
CO2: 24 mmol/L (ref 22–32)
Chloride: 105 mmol/L (ref 98–111)
Creatinine, Ser: 1.1 mg/dL (ref 0.61–1.24)
Glucose, Bld: 99 mg/dL (ref 70–99)
Potassium: 4.1 mmol/L (ref 3.5–5.1)
SODIUM: 135 mmol/L (ref 135–145)
Total Bilirubin: 1.1 mg/dL (ref 0.3–1.2)
Total Protein: 5.8 g/dL — ABNORMAL LOW (ref 6.5–8.1)

## 2018-11-09 LAB — CBC
HCT: 41.8 % (ref 39.0–52.0)
Hemoglobin: 13.8 g/dL (ref 13.0–17.0)
MCH: 29.9 pg (ref 26.0–34.0)
MCHC: 33 g/dL (ref 30.0–36.0)
MCV: 90.5 fL (ref 80.0–100.0)
PLATELETS: 200 10*3/uL (ref 150–400)
RBC: 4.62 MIL/uL (ref 4.22–5.81)
RDW: 13.1 % (ref 11.5–15.5)
WBC: 14 10*3/uL — ABNORMAL HIGH (ref 4.0–10.5)
nRBC: 0 % (ref 0.0–0.2)

## 2018-11-09 MED ORDER — AZITHROMYCIN 250 MG PO TABS: 250.0000 mg | ORAL_TABLET | Freq: Every day | ORAL | 0 refills | Status: AC

## 2018-11-09 MED ORDER — METOPROLOL SUCCINATE ER 50 MG PO TB24: 50.0000 mg | ORAL_TABLET | Freq: Every day | ORAL | 0 refills | Status: DC

## 2018-11-09 MED ORDER — PANTOPRAZOLE SODIUM 40 MG PO TBEC: 40.0000 mg | DELAYED_RELEASE_TABLET | Freq: Every day | ORAL | 0 refills | Status: AC

## 2018-11-09 MED ORDER — AZITHROMYCIN 250 MG PO TABS: 500.0000 mg | ORAL_TABLET | Freq: Every day | ORAL | Status: DC

## 2018-11-09 MED ORDER — CEFDINIR 300 MG PO CAPS: 300.0000 mg | ORAL_CAPSULE | Freq: Two times a day (BID) | ORAL | 0 refills | Status: AC

## 2018-11-09 MED ORDER — COLCHICINE 0.6 MG PO TABS: 0.6000 mg | ORAL_TABLET | Freq: Two times a day (BID) | ORAL | 0 refills | Status: AC

## 2018-11-09 MED ORDER — IBUPROFEN 600 MG PO TABS: 600.0000 mg | ORAL_TABLET | Freq: Three times a day (TID) | ORAL | 0 refills | Status: DC

## 2018-11-09 NOTE — Progress Notes (Signed)
PHARMACIST - PHYSICIAN COMMUNICATION DR:   Nelson ChimesAmin CONCERNING: Antibiotic IV to Oral Route Change Policy  RECOMMENDATION: This patient is receiving Azithromycin by the intravenous route.  Based on criteria approved by the Pharmacy and Therapeutics Committee, the antibiotic(s) is/are being converted to the equivalent oral dose form(s).   DESCRIPTION: These criteria include:  Patient being treated for a respiratory tract infection, urinary tract infection, cellulitis or clostridium difficile associated diarrhea if on metronidazole  The patient is not neutropenic and does not exhibit a GI malabsorption state  The patient is eating (either orally or via tube) and/or has been taking other orally administered medications for a least 24 hours  The patient is improving clinically and has a Tmax < 100.5  If you have questions about this conversion, please contact the Pharmacy Department  []   [phone]( [phone] )  [facility] []   [phone]( [phone] )  [facility] [x]   [phone]( [phone] )  [facility] []   [phone]( [phone] )  [facility] []   [phone]( [phone] )  [facility]    Georgina PillionElizabeth Navina Wohlers, PharmD, BCPS Pager: [phone] 1:08 PM

## 2018-11-09 NOTE — Discharge Instructions (Signed)
Treatment plan: - Continue ibuprofen for now (scheduled, NOT just as-needed). If you are still having symptoms at your follow-up appointment, we may need to extend this. Take this with food. If you notice any stomach upset or signs of gastrointestinal bleeding such as blood in stool or black stools, stop this medicine and call your doctor immediately - We recommend you take a proton-pump inhibitor medicine such as omeprazole or pantoprazole to protect your stomach while taking ibuprofen. - Continue colchicine for 3 months.

## 2018-11-09 NOTE — Discharge Summary (Signed)
Physician Discharge Summary  Jadiel J Kissick MRN:[id number] DOB: 05/20/1966 DOA: 11/07/2018  PCP: Sun, Yun, MD  Admit date: 11/07/2018 Discharge date: 11/09/2018  Admitted From: Home Disposition: Home  Recommendations for Outpatient Follow-up:  1. Follow up with PCP in 1-2 weeks 2. Please obtain BMP/CBC in one week your next doctors visit.  3. Take oral colchicine twice daily for 3 months 4. Take ibuprofen 600 mg 3 times a day as prescribed until seen by cardiology on 11/17/2018.  Further instructions will be given 5. Take oral Protonix as prescribed 6. Follow-up with outpatient gastroenterology in 3-4 weeks. 7. Take oral azithromycin and Omnicef as prescribed.  Home Health: Home Equipment/Devices: Home Discharge Condition: Stable CODE STATUS: Full code Diet recommendation: 2 g salt diet  Brief/Interim Summary:  52-year-old with a history of essential hypertension, bipolar disorder came to the hospital with complains of substernal pleuritic chest pain.  His cardiac enzymes remain pretty much flat but his CRP and ESR were elevated.  CT of the abdomen pelvis was suggestive of a small umbilical hernia, bibasilar opacity reflecting mild pneumonia.  Echocardiogram showed ejection fraction 55 to 60% with small free-flowing pericardial effusion without any hemodynamic compromise.  It was thought patient symptoms could be secondary to acute pericarditis therefore started on ibuprofen and colchicine.  Over the course of 24 hours his symptoms significantly improved.  He also reported of some dysphagia therefore underwent esophageal study which showed concerns for tracheal aspirate therefore he was given instructions to use aspiration precaution.  But on the day of discharge she felt much better and denied any complaints therefore I recommend that he follow-up outpatient with gastroenterology and primary care provider in regards to this.  He may also benefit from outpatient swallow rehabilitation if  necessary.  At this time he is reached maximum benefit from hospital stay with recommendations as stated above.  Discharge Diagnoses:  Principal Problem:   Chest pain Active Problems:   BIPOLAR DISORDER UNSPECIFIED   Essential hypertension   Hyperglycemia   Leukocytosis   Tobacco dependence  Atypical chest pain concerning for acute pericarditis with small pericardial effusion - Cardiac enzymes remain negative.  CRP and ESR were elevated showing concerns for acute pericarditis -Patient started on ibuprofen 600 mg 3 times daily along with PPI -Take oral colchicine twice daily for 3 months -Follow-up outpatient with cardiology on 11/17/2018.  Further instructions will be provided at that time -Echocardiogram showed ejection fraction 55 to 60% with small pericardial effusion without any hemodynamic compromise - Continue to provide supportive care.  Community-acquired pneumonia - Advised to complete course of oral azithromycin and Omnicef.  Dysphagia, improved Tracheal aspirate -Patient has been advised to use basic aspiration precaution.  He needs to follow-up outpatient with gastroenterology and even may end up requiring ENT follow-up if this worsens.  Essential hypertension -Resume home meds including Toprol  Tobacco use -Counseled to quit using tobacco.  On Lovenox for DVT prophylaxis while here Discharge today patient in stable condition He is a full code.  Discharge Instructions   Allergies as of 11/09/2018      Reactions   Prednisone Anxiety, Other (See Comments)   Hallucinations, also   Ivp Dye [iodinated Diagnostic Agents] Itching   Metrizamide Itching   (Metrizamide is a non-ionic, iodine-based, radiocontrast agent)      Medication List    TAKE these medications   azithromycin 250 MG tablet Commonly known as:  ZITHROMAX Take 1 tablet (250 mg total) by mouth daily for 5 days.     cefdinir 300 MG capsule Commonly known as:  OMNICEF Take 1 capsule (300 mg  total) by mouth 2 (two) times daily for 4 days.   colchicine 0.6 MG tablet Take 1 tablet (0.6 mg total) by mouth 2 (two) times daily.   HYDROcodone-acetaminophen 5-325 MG tablet Commonly known as:  NORCO/VICODIN Take 1 tablet by mouth 2 (two) times daily as needed for moderate pain.   ibuprofen 600 MG tablet Commonly known as:  ADVIL,MOTRIN Take 1 tablet (600 mg total) by mouth 3 (three) times daily with meals for 8 days.   metoprolol succinate 50 MG 24 hr tablet Commonly known as:  TOPROL-XL Take 1 tablet (50 mg total) by mouth daily. Take with or immediately following a meal. Start taking on:  11/10/2018   pantoprazole 40 MG tablet Commonly known as:  PROTONIX Take 1 tablet (40 mg total) by mouth daily before breakfast.   testosterone cypionate 100 MG/ML injection Commonly known as:  DEPOTESTOTERONE CYPIONATE Inject 200 mg into the muscle every 14 (fourteen) days. For IM use only   zolpidem 10 MG tablet Commonly known as:  AMBIEN Take 5 mg by mouth at bedtime as needed for sleep.      Follow-up Information    Almyra Deforest, [place] Follow up.   Specialties:  Cardiology, Radiology Why:  [facility] - 11/17/18 at 8:30am. Arrive 15 minutes prior to appointment to check in. Isaac Laud is one of the physician assistants with our cardiology group who will coordinate care with your primary cardiologist. Contact information: [address] [place] [address] [phone]        Sandi Mariscal, MD. Schedule an appointment as soon as possible for a visit in 1 week(s).   Specialty:  Internal Medicine Contact information: [place] [address] [phone]        Pixie Casino, MD .   Specialty:  Cardiology Contact information: [address] [place] [address] [phone]          Allergies  Allergen Reactions  . Prednisone Anxiety and Other (See Comments)    Hallucinations, also  . Ivp Dye [Iodinated Diagnostic Agents]  Itching  . Metrizamide Itching    (Metrizamide is a non-ionic, iodine-based, radiocontrast agent)    You were cared for by a hospitalist during your hospital stay. If you have any questions about your discharge medications or the care you received while you were in the hospital after you are discharged, you can call the unit and asked to speak with the hospitalist on call if the hospitalist that took care of you is not available. Once you are discharged, your primary care physician will handle any further medical issues. Please note that no refills for any discharge medications will be authorized once you are discharged, as it is imperative that you return to your primary care physician (or establish a relationship with a primary care physician if you do not have one) for your aftercare needs so that they can reassess your need for medications and monitor your lab values.  Consultations:  Cardiology   Procedures/Studies: Ct Abdomen Pelvis Wo Contrast  Result Date: 11/06/2018 CLINICAL DATA:  Acute onset of epigastric abdominal pain. EXAM: CT ABDOMEN AND PELVIS WITHOUT CONTRAST TECHNIQUE: Multidetector CT imaging of the abdomen and pelvis was performed following the standard protocol without IV contrast. COMPARISON:  CT of the abdomen and pelvis performed 11/12/2017 FINDINGS: Lower chest: Bibasilar opacities may reflect atelectasis or possibly mild pneumonia. The visualized portions of the  mediastinum are unremarkable. Hepatobiliary: The liver is unremarkable in appearance. The gallbladder is unremarkable in appearance. The common bile duct remains normal in caliber. Pancreas: The pancreas is within normal limits. Spleen: The spleen is unremarkable in appearance. Adrenals/Urinary Tract: The adrenal glands are unremarkable in appearance. Nonspecific perinephric stranding is noted bilaterally. There is no evidence of hydronephrosis. No renal or ureteral stones are identified. Stomach/Bowel: The stomach  is unremarkable in appearance. The small bowel is within normal limits. The appendix is normal in caliber, without evidence of appendicitis. Mild scattered diverticulosis is noted about much of the colon, without evidence of diverticulitis. Vascular/Lymphatic: The abdominal aorta is unremarkable in appearance. The inferior vena cava is grossly unremarkable. No retroperitoneal lymphadenopathy is seen. No pelvic sidewall lymphadenopathy is identified. Reproductive: The bladder is mildly distended and grossly unremarkable. The prostate remains normal in size. Other: A small umbilical hernia is noted, containing only fat. No significant associated inflammation is seen, though this might explain the patient's symptoms. Musculoskeletal: No acute osseous abnormalities are identified. The visualized musculature is unremarkable in appearance. IMPRESSION: 1. Small umbilical hernia, containing only fat. No significant associated inflammation seen, though this might explain the patient's symptoms. 2. Bibasilar airspace opacities may reflect atelectasis or possibly mild pneumonia. 3. Mild scattered diverticulosis about much of the colon, without evidence of diverticulitis. Electronically Signed   By: Jeffery  Chang M.D.   On: 11/06/2018 04:03   Dg Chest 2 View  Result Date: 11/07/2018 CLINICAL DATA:  Initial evaluation for acute chest and epigastric pain. EXAM: CHEST - 2 VIEW COMPARISON:  Prior radiograph from 11/05/2018. FINDINGS: Mild cardiomegaly, stable. Mediastinal silhouette normal. Aortic atherosclerosis. Lungs hypoinflated. Mild streaky bibasilar opacities most likely reflect atelectasis, similar to previous. No other focal airspace disease. No appreciable pleural effusion. No pulmonary edema. No pneumothorax. No acute osseus abnormality. IMPRESSION: 1. Shallow lung inflation with associated mild bibasilar atelectasis. 2. No other active cardiopulmonary disease. Electronically Signed   By: Benjamin  McClintock M.D.    On: 11/07/2018 03:26   Dg Chest 2 View  Result Date: 11/05/2018 CLINICAL DATA:  Acute onset of central chest pain and pressure. Shortness of breath. EXAM: CHEST - 2 VIEW COMPARISON:  Chest radiograph and CTA of the chest performed 06/02/2016 FINDINGS: The lungs are well-aerated. Mild bibasilar opacities likely reflect atelectasis. No pleural effusion or pneumothorax is seen. The heart is normal in size; the mediastinal contour is within normal limits. No acute osseous abnormalities are seen. There is mild chronic resorption or resection at the distal right clavicle. IMPRESSION: Mild bibasilar opacities likely reflect atelectasis; lungs otherwise clear. Electronically Signed   By: Jeffery  Chang M.D.   On: 11/05/2018 23:10   Dg Esophagus  Result Date: 11/08/2018 CLINICAL DATA:  Epigastric pain since Saturday. Worsens in supine or prone position and after eating. EXAM: ESOPHOGRAM / BARIUM SWALLOW / BARIUM TABLET STUDY TECHNIQUE: Combined double contrast and single contrast examination performed using effervescent crystals, thick barium liquid, and thin barium liquid. The patient was observed with fluoroscopy swallowing a 13 mm barium sulphate tablet. FLUOROSCOPY TIME:  Fluoroscopy Time:  1 minutes, 36 seconds Radiation Exposure Index (if provided by the fluoroscopic device): 0.8 mGy Number of Acquired Spot Images: 0 COMPARISON:  CT abdomen 11/06/2018 FINDINGS: On the initial swallow there was aspiration of barium into the trachea. This is shown for example on image 19/3 prior to the second swallow. Laryngeal penetration on the second swallow observed. Primary peristaltic waves in the esophagus were normal on 4/4 swallows. Mildly prominent   cricopharyngeus. No significant narrowing or ulceration in the esophagus identified. A 13 mm area of barium pill passed into the stomach without a significant degree of delay. IMPRESSION: 1. Frank tracheal aspiration on the initial swallow, and laryngeal penetration on the  second swallow. 2. No significant esophageal dysmotility, ulceration, or stricture. No reflux was observed during the course of today's exam. A 13 mm barium pill passed into the stomach without a significant degree of delay. Electronically Signed   By: Walter  Liebkemann M.D.   On: 11/08/2018 16:57      Subjective: No complaints, feeling better.  States his chest pain is resolved and also states he no longer is having difficulty with swallowing food.  General = no fevers, chills, dizziness, malaise, fatigue HEENT/EYES = negative for pain, redness, loss of vision, double vision, blurred vision, loss of hearing, sore throat, hoarseness, dysphagia Cardiovascular= negative for chest pain, palpitation, murmurs, lower extremity swelling Respiratory/lungs= negative for shortness of breath, cough, hemoptysis, wheezing, mucus production Gastrointestinal= negative for nausea, vomiting,, abdominal pain, melena, hematemesis Genitourinary= negative for Dysuria, Hematuria, Change in Urinary Frequency MSK = Negative for arthralgia, myalgias, Back Pain, Joint swelling  Neurology= Negative for headache, seizures, numbness, tingling  Psychiatry= Negative for anxiety, depression, suicidal and homocidal ideation Allergy/Immunology= Medication/Food allergy as listed  Skin= Negative for Rash, lesions, ulcers, itching   Discharge Exam: Vitals:   11/09/18 1057 11/09/18 1159  BP: 107/76 117/70  Pulse: 76 72  Resp: 20 18  Temp: 98.3 F (36.8 C) 98.5 F (36.9 C)  SpO2: 96% 96%   Vitals:   11/09/18 0011 11/09/18 0517 11/09/18 1057 11/09/18 1159  BP: 106/71 100/62 107/76 117/70  Pulse: 69 66 76 72  Resp: 16 18 20 18  Temp: 98.2 F (36.8 C) 97.8 F (36.6 C) 98.3 F (36.8 C) 98.5 F (36.9 C)  TempSrc: Oral Oral Oral Oral  SpO2: 96% 97% 96% 96%  Weight:      Height:        General: Pt is alert, awake, not in acute distress Cardiovascular: RRR, S1/S2 +, no rubs, no gallops Respiratory: CTA  bilaterally, no wheezing, no rhonchi Abdominal: Soft, NT, ND, bowel sounds + Extremities: no edema, no cyanosis    The results of significant diagnostics from this hospitalization (including imaging, microbiology, ancillary and laboratory) are listed below for reference.     Microbiology: Recent Results (from the past 240 hour(s))  Culture, blood (Routine X 2) w Reflex to ID Panel     Status: None (Preliminary result)   Collection Time: 11/08/18 10:20 AM  Result Value Ref Range Status   Specimen Description BLOOD RIGHT ANTECUBITAL  Final   Special Requests   Final    BOTTLES DRAWN AEROBIC AND ANAEROBIC Blood Culture adequate volume   Culture   Final    NO GROWTH < 24 HOURS Performed at [facility], [address] [place], [place] [address]    Report Status PENDING  Incomplete  Culture, blood (Routine X 2) w Reflex to ID Panel     Status: None (Preliminary result)   Collection Time: 11/08/18 10:25 AM  Result Value Ref Range Status   Specimen Description BLOOD LEFT HAND  Final   Special Requests   Final    BOTTLES DRAWN AEROBIC ONLY Blood Culture results may not be optimal due to an inadequate volume of blood received in culture bottles   Culture   Final    NO GROWTH < 24 HOURS Performed at [facility]   Lab, [address] [place], [place] [address]    Report Status PENDING  Incomplete     Labs: BNP (last 3 results) No results for input(s): BNP in the last 8760 hours. Basic Metabolic Panel: Recent Labs  Lab 11/05/18 2228 11/07/18 0305 11/08/18 0841 11/09/18 0325  NA 136 136 132* 135  K 4.5 3.9 4.3 4.1  CL 102 100 102 105  CO2 30 28 22 24  GLUCOSE 104* 147* 130* 99  BUN 19 16 22* 27*  CREATININE 1.10 1.09 1.07 1.10  CALCIUM 8.9 9.0 9.0 8.1*   Liver Function Tests: Recent Labs  Lab 11/05/18 2228 11/07/18 0305 11/09/18 0325  AST 20 20 16  ALT 25 22 25  ALKPHOS 62 70 57  BILITOT 0.3 1.1 1.1  PROT 6.5 6.7 5.8*  ALBUMIN 3.6 3.7 2.7*   Recent Labs   Lab 11/07/18 0305  LIPASE 38   No results for input(s): AMMONIA in the last 168 hours. CBC: Recent Labs  Lab 11/05/18 2228 11/07/18 0305 11/08/18 0841 11/09/18 0325  WBC 13.3* 16.0* 19.8* 14.0*  NEUTROABS 9.3*  --   --   --   HGB 17.5* 17.0 17.1* 13.8  HCT 54.3* 52.9* 51.2 41.8  MCV 92.3 91.8 89.5 90.5  PLT 202 207 241 200   Cardiac Enzymes: Recent Labs  Lab 11/07/18 0811 11/07/18 1442 11/07/18 1950  TROPONINI <0.03 <0.03 <0.03   BNP: Invalid input(s): POCBNP CBG: Recent Labs  Lab 11/07/18 1648 11/07/18 2225 11/08/18 0551 11/08/18 0739 11/08/18 1147  GLUCAP 106* 110* 126* 117* 116*   D-Dimer Recent Labs    11/07/18 0219  DDIMER 0.32   Hgb A1c Recent Labs    11/07/18 0811  HGBA1C 5.2   Lipid Profile Recent Labs    11/07/18 0811  CHOL 143  HDL 46  LDLCALC 90  TRIG 34  CHOLHDL 3.1   Thyroid function studies Recent Labs    11/07/18 0811  TSH 1.335   Anemia work up No results for input(s): VITAMINB12, FOLATE, FERRITIN, TIBC, IRON, RETICCTPCT in the last 72 hours. Urinalysis    Component Value Date/Time   COLORURINE AMBER (A) 11/08/2018 0939   APPEARANCEUR HAZY (A) 11/08/2018 0939   LABSPEC 1.033 (H) 11/08/2018 0939   PHURINE 6.0 11/08/2018 0939   GLUCOSEU NEGATIVE 11/08/2018 0939   HGBUR MODERATE (A) 11/08/2018 0939   BILIRUBINUR NEGATIVE 11/08/2018 0939   KETONESUR 5 (A) 11/08/2018 0939   PROTEINUR 30 (A) 11/08/2018 0939   UROBILINOGEN 0.2 03/25/2010 0917   NITRITE NEGATIVE 11/08/2018 0939   LEUKOCYTESUR NEGATIVE 11/08/2018 0939   Sepsis Labs Invalid input(s): PROCALCITONIN,  WBC,  LACTICIDVEN Microbiology Recent Results (from the past 240 hour(s))  Culture, blood (Routine X 2) w Reflex to ID Panel     Status: None (Preliminary result)   Collection Time: 11/08/18 10:20 AM  Result Value Ref Range Status   Specimen Description BLOOD RIGHT ANTECUBITAL  Final   Special Requests   Final    BOTTLES DRAWN AEROBIC AND ANAEROBIC Blood  Culture adequate volume   Culture   Final    NO GROWTH < 24 HOURS Performed at [facility], [address] [place], [place] [address]    Report Status PENDING  Incomplete  Culture, blood (Routine X 2) w Reflex to ID Panel     Status: None (Preliminary result)   Collection Time: 11/08/18 10:25 AM  Result Value Ref Range Status   Specimen Description BLOOD LEFT HAND  Final     Special Requests   Final    BOTTLES DRAWN AEROBIC ONLY Blood Culture results may not be optimal due to an inadequate volume of blood received in culture bottles   Culture   Final    NO GROWTH < 24 HOURS Performed at [facility] [address], [place], [place] [address]    Report Status PENDING  Incomplete     Time coordinating discharge:  I have spent 35 minutes face to face with the patient and on the ward discussing the patients care, assessment, plan and disposition with other care givers. >50% of the time was devoted counseling the patient about the risks and benefits of treatment/Discharge disposition and coordinating care.   SIGNED:   Damita Lack, MD  [facility] 11/09/2018, 1:38 PM Pager   If 7PM-7AM, please contact night-coverage [email or web address] Password [email or web address]

## 2018-11-09 NOTE — Progress Notes (Addendum)
Progress Note  Patient Name: Mathew Gonzalez Date of Encounter: 11/09/2018  Primary Cardiologist: Pixie Casino, MD  Subjective   Feeling significantly better. Leaning back in bed without issue  Inpatient Medications    Scheduled Meds: . aspirin EC  81 mg Oral Daily  . colchicine  0.6 mg Oral BID  . enoxaparin (LOVENOX) injection  40 mg Subcutaneous Daily  . ibuprofen  600 mg Oral TID WC  . metoprolol succinate  50 mg Oral Daily  . nicotine  14 mg Transdermal Daily  . pantoprazole  40 mg Oral BID   Continuous Infusions: . azithromycin 500 mg (11/09/18 1130)  . cefTRIAXone (ROCEPHIN)  IV 1 g (11/09/18 1050)   PRN Meds: acetaminophen, HYDROcodone-acetaminophen, morphine injection, ondansetron (ZOFRAN) IV, zolpidem   Vital Signs    Vitals:   11/09/18 0011 11/09/18 0517 11/09/18 1057 11/09/18 1159  BP: 106/71 100/62 107/76 117/70  Pulse: 69 66 76 72  Resp: _0 Temp: 98.2 F (36.8 C) 97.8 F (36.6 C) 98.3 F (36.8 C) 98.5 F (36.9 C)  TempSrc: Oral Oral Oral Oral  SpO2: 96% 97% 96% 96%  Weight:      Height:        Intake/Output Summary (Last 24 hours) at 11/09/2018 1202 Last data filed at 11/09/2018 0500 Gross per 24 hour  Intake 3280 ml  Output -  Net 3280 ml   Filed Weights   11/07/18 0253  Weight: 104.3 kg    Telemetry    NSR - Personally Reviewed  Physical Exam   GEN: No acute distress.  HEENT: Normocephalic, atraumatic, sclera non-icteric. Neck: No JVD or bruits. Cardiac: RRR no murmurs, rubs, or gallops.  Radials/DP/PT 1+ and equal bilaterally.  Respiratory: Clear to auscultation bilaterally. Breathing is unlabored. GI: Soft, nontender, non-distended, BS +x 4. MS: no deformity. Extremities: No clubbing or cyanosis. No edema. Distal pedal pulses are 2+ and equal bilaterally. Neuro:  AAOx3. Follows commands. Psych:  Responds to questions appropriately with a normal affect.  Labs    Chemistry Recent Labs  Lab 11/05/18 2228  11/07/18 0305 11/08/18 0841 11/09/18 0325  NA 136 136 132* 135  K 4.5 3.9 4.3 4.1  CL 102 100 102 105  CO2 _1 GLUCOSE 104* 147* 130* 99  BUN 19 16 22* 27*  CREATININE 1.10 1.09 1.07 1.10  CALCIUM 8.9 9.0 9.0 8.1*  PROT 6.5 6.7  --  5.8*  ALBUMIN 3.6 3.7  --  2.7*  AST 20 20  --  16  ALT 25 22  --  25  ALKPHOS 62 70  --  57  BILITOT 0.3 1.1  --  1.1  GFRNONAA >60 >60 >60 >60  GFRAA >60 >60 >60 >60  ANIONGAP 4* _2 Hematology Recent Labs  Lab 11/07/18 0305 11/08/18 0841 11/09/18 0325  WBC 16.0* 19.8* 14.0*  RBC 5.76 5.72 4.62  HGB 17.0 17.1* 13.8  HCT 52.9* 51.2 41.8  MCV 91.8 89.5 90.5  MCH 29.5 29.9 29.9  MCHC 32.1 33.4 33.0  RDW 13.2 13.1 13.1  PLT 207 241 200    Cardiac Enzymes Recent Labs  Lab 11/07/18 0811 11/07/18 1442 11/07/18 1950  TROPONINI <0.03 <0.03 <0.03    Recent Labs  Lab 11/05/18 2231 11/07/18 0339  TROPIPOC 0.00 0.00     BNPNo results for input(s): BNP, PROBNP in the last 168 hours.   DDimer  Recent Labs  Lab 11/07/18  7867  DDIMER 0.32     Radiology    Dg Esophagus  Result Date: 11/08/2018 CLINICAL DATA:  Epigastric pain since Saturday. Worsens in supine or prone position and after eating. EXAM: ESOPHOGRAM / BARIUM SWALLOW / BARIUM TABLET STUDY TECHNIQUE: Combined double contrast and single contrast examination performed using effervescent crystals, thick barium liquid, and thin barium liquid. The patient was observed with fluoroscopy swallowing a 13 mm barium sulphate tablet. FLUOROSCOPY TIME:  Fluoroscopy Time:  1 minutes, 36 seconds Radiation Exposure Index (if provided by the fluoroscopic device): 0.8 mGy Number of Acquired Spot Images: 0 COMPARISON:  CT abdomen 11/06/2018 FINDINGS: On the initial swallow there was aspiration of barium into the trachea. This is shown for example on image 19/3 prior to the second swallow. Laryngeal penetration on the second swallow observed. Primary peristaltic waves in the  esophagus were normal on 4/4 swallows. Mildly prominent cricopharyngeus. No significant narrowing or ulceration in the esophagus identified. A 13 mm area of barium pill passed into the stomach without a significant degree of delay. IMPRESSION: 1. Frank tracheal aspiration on the initial swallow, and laryngeal penetration on the second swallow. 2. No significant esophageal dysmotility, ulceration, or stricture. No reflux was observed during the course of today's exam. A 13 mm barium pill passed into the stomach without a significant degree of delay. Electronically Signed   By: Van Clines M.D.   On: 11/08/2018 16:57    Cardiac Studies   2d echo yesterday ------------------------------------------------------------------- Study Conclusions  - Left ventricle: The cavity size was normal. Wall thickness was   increased in a pattern of mild LVH. Systolic function was normal.   The estimated ejection fraction was in the range of 55% to 60%.   Wall motion was normal; there were no regional wall motion   abnormalities. Left ventricular diastolic function parameters   were normal. - Right ventricle: The cavity size was mildly dilated. Wall   thickness was normal. - Pericardium, extracardiac: A small, free-flowing pericardial   effusion was identified circumferential to the heart. There was   no evidence of hemodynamic compromise.  Patient Profile     52 y.o. male with HTN, bipolar d/o (not on meds), tobacco use, chronic pain (on pain contract), prior ETOH who was recently seen in ER 11/9 with epigastric/sharp chest pain and leukocytosis. CT abd pelvis showed lower portions of mediastinum normal, +small umbilical hernia containing only fat, bibasilar opacities may reflect atelectasis or mild PNA, mild scattered diverticulosis. DC home but returned yesterday with again constant substernal chest pain - radiating into his jaw and ears. Also noted to have worsening leukocytosis and febrile to  100.4.  Assessment & Plan    1. Chest pain suspected due to acute pericarditis with pericardial effusion - interesting presentation. He did have CP with recumbency but also had some discomfort on swallowing cold liquids. CRP/ESR elevated. Esophageal study showed some tracheal aspiration for which IM plans OP f/u. Symptoms greatly improved with addition of ibuprofen and colchicine. Per guidelines, would a) continue ibuprofen 625m TID scheduled until OV 11/21 to assess candidacy for tapering, b) continue colchicine for 3 months to prevent recurrence, c) suggest continuing PPI while on ibuprofen. Discussed recs with Dr. AReesa Chew Advised patient to take meds with food. At f/u may consider trending CRP/ESR. Hgb is noted to be down from admission 17->13.8 but patient was also felt to be dehydrated with dark urine and received IV fluids. This will need OP f/u. I will review final rec with Dr.   whether aspirin should be continued. I stopped this for now.  2. HTN - continue Toprol.   3. Dark urine - in setting of poor oral intake. Hemodynamics improved s/p IV fluids.  4. Tobacco abuse - counseled on cessation.  Included dotphrase on AVS: Treatment plan: - Continue ibuprofen for now (scheduled, NOT just as-needed). If you are still having symptoms at your follow-up appointment, we may need to extend this. Take this with food. If you notice any stomach upset or signs of gastrointestinal bleeding such as blood in stool or black stools, stop this medicine and call your doctor immediately - We recommend you take a proton-pump inhibitor medicine such as omeprazole or pantoprazole to protect your stomach while taking ibuprofen. - Continue colchicine for 3 months.   F/u arranged 11/17/18 at 8:30am in our office.  For questions or updates, please contact [place] Please consult [email or web address] for contact info under Cardiology/STEMI.  Signed, Charlie Pitter, PA-C 11/09/2018, 12:02 PM    Personally  seen and examined. Agree with above.  Chest pain improved, no shortness of breath.  Wife in room.  GEN: Well nourished, well developed, in no acute distress  HEENT: normal  Neck: no JVD, carotid bruits, or masses Cardiac: RRR; no murmurs, rubs, or gallops,no edema  Respiratory:  clear to auscultation bilaterally, normal work of breathing GI: soft, nontender, nondistended, + BS MS: no deformity or atrophy  Skin: warm and dry, no rash Neuro:  Alert and Oriented x 3, Strength and sensation are intact Psych: euthymic mood, full affect  Labs personally reviewed.  C-reactive protein 25 on 11/08/2018.  Acute pericarditis with small pericardial effusion - Mild changes noted on ECG. -CRP elevated. - Ibuprofen and colchicine.  See above for details on dosing. - Continue to slowly increase daily activity.  We will have follow-up in clinic.  Candee Furbish, MD

## 2018-11-15 LAB — CULTURE, BLOOD (ROUTINE X 2)
CULTURE: NO GROWTH
Culture: NO GROWTH
SPECIAL REQUESTS: ADEQUATE

## 2018-11-17 ENCOUNTER — Encounter: Payer: Self-pay | Admitting: Physician Assistant

## 2018-11-17 ENCOUNTER — Ambulatory Visit: Payer: BLUE CROSS/BLUE SHIELD | Admitting: Physician Assistant

## 2018-11-17 VITALS — BP 133/81 | HR 86 | Resp 16 | Ht 73.0 in | Wt 239.0 lb

## 2018-11-17 DIAGNOSIS — I10 Essential (primary) hypertension: Secondary | ICD-10-CM | POA: Diagnosis not present

## 2018-11-17 DIAGNOSIS — Z720 Tobacco use: Secondary | ICD-10-CM | POA: Diagnosis not present

## 2018-11-17 DIAGNOSIS — I319 Disease of pericardium, unspecified: Secondary | ICD-10-CM

## 2018-11-17 MED ORDER — METOPROLOL SUCCINATE ER 25 MG PO TB24: 25.0000 mg | ORAL_TABLET | Freq: Every day | ORAL | 3 refills | Status: AC

## 2018-11-17 MED ORDER — NAPROXEN 500 MG PO TABS: 500.0000 mg | ORAL_TABLET | Freq: Every day | ORAL | 0 refills | Status: AC

## 2018-11-17 NOTE — Progress Notes (Signed)
Cardiology Office Note    Date:  11/17/2018   ID:  Mathew Gonzalez, DOB 22-Jul-1966, MRN [id number]  PCP:  Sandi Mariscal, MD  Cardiologist: Dr. Marlou Porch  Chief Complaint  Patient presents with  . Chest Pain    hurts in the A.M while laying down     History of Present Illness:  Mathew Gonzalez is a 52 y.o. male with past medical history of hypertension, tobacco abuse and bipolar disorder.  He has no family history of CAD.  In May 2015, patient was referred to cardiology service for evaluation of tachycardia and dizziness.  Symptoms improved with addition of Toprol-XL.  TSH was normal at the time.  Echocardiogram obtained on 05/17/2014 showed mild LVH, normal EF and wall motion.  Nuclear stress test performed on the same day showed EF 59%, normal wall motion and perfusion.  More recently, patient presented to the hospital on 11/07/2018 with chest pain.  Serial troponin was negative.  He has a baseline right bundle branch block which is unchanged.  Repeat 2D echo showed EF 55 to 60%, mildly dilated RV, small free-flowing pericardial effusion identified circumferential to the heart without hemodynamic compromise.  Given CRP elevation and mild changes on the EKG.  It was felt patient likely has acute pericarditis with small pericardial effusion.  He was placed on ibuprofen and colchicine therapy.  He did have a barium swallow during the recent hospitalization that showed frank tracheal aspiration on the initial swallow and the laryngeal penetration on the second swallow, no significant dysmotility, ulceration or stricture noted.  Patient presents today for cardiology office visit.  He continued to have very mild chest pain worse with laying down or deep inspiration.  He does not notice the chest pain when he walks around.  He has completed 8 days of ibuprofen.  He says ibuprofen is causing too much GI upset.  He has taken naproxen in the past without much issue.  I recommend another 7-day of naproxen.  He will  complete 9-monthcourse of colchicine, after which, he can follow-up with Dr. SMarlou Porch  Although Dr. HDebara Pickettwas listed as his primary cardiologist, however it does not appears Dr. HDebara Picketthas ever met the patient.  Therefore, I plan to set the patient up with Dr. SMarlou Porch  If chest pain worsens during this period, then we will obtain a repeat limited echo.  Note, patient is not aware of potential cause for pericarditis.  He denies any prior diagnosis of lupus or rheumatoid arthritis, he has no prior history of TB, his thyroid labs during the recent hospitalization was normal.   Past Medical History:  Diagnosis Date  . Arthritis    osteoarthritis, on chronic hydrocodone   . Bipolar disorder ([place]   . Hypertension    not on medication    Past Surgical History:  Procedure Laterality Date  . REPLACEMENT TOTAL KNEE    . ROTATOR CUFF REPAIR      Current Medications: Outpatient Medications Prior to Visit  Medication Sig Dispense Refill  . colchicine 0.6 MG tablet Take 1 tablet (0.6 mg total) by mouth 2 (two) times daily. 180 tablet 0  . HYDROcodone-acetaminophen (NORCO/VICODIN) 5-325 MG tablet Take 1 tablet by mouth 2 (two) times daily as needed for moderate pain.    . pantoprazole (PROTONIX) 40 MG tablet Take 1 tablet (40 mg total) by mouth daily before breakfast. 30 tablet 0  . testosterone cypionate (DEPOTESTOTERONE CYPIONATE) 100 MG/ML injection Inject 200 mg into the muscle every  14 (fourteen) days. For IM use only    . zolpidem (AMBIEN) 10 MG tablet Take 5 mg by mouth at bedtime as needed for sleep.    Marland Kitchen ibuprofen (ADVIL,MOTRIN) 600 MG tablet Take 1 tablet (600 mg total) by mouth 3 (three) times daily with meals for 8 days. 24 tablet 0  . metoprolol succinate (TOPROL-XL) 50 MG 24 hr tablet Take 1 tablet (50 mg total) by mouth daily. Take with or immediately following a meal. 30 tablet 0   No facility-administered medications prior to visit.      Allergies:   Prednisone; Ivp dye [iodinated  diagnostic agents]; and Metrizamide   Social History   Socioeconomic History  . Marital status: Single    Spouse name: Not on file  . Number of children: Not on file  . Years of education: Not on file  . Highest education level: Not on file  Occupational History  . Occupation: Haematologist  Social Needs  . Financial resource strain: Not on file  . Food insecurity:    Worry: Not on file    Inability: Not on file  . Transportation needs:    Medical: Not on file    Non-medical: Not on file  Tobacco Use  . Smoking status: Current Every Day Smoker    Types: Pipe  . Smokeless tobacco: Never Used  Substance and Sexual Activity  . Alcohol use: Not Currently  . Drug use: No    Types: Marijuana    Comment: remote history  . Sexual activity: Not on file  Lifestyle  . Physical activity:    Days per week: Not on file    Minutes per session: Not on file  . Stress: Not on file  Relationships  . Social connections:    Talks on phone: Not on file    Gets together: Not on file    Attends religious service: Not on file    Active member of club or organization: Not on file    Attends meetings of clubs or organizations: Not on file    Relationship status: Not on file  Other Topics Concern  . Not on file  Social History Narrative  . Not on file     Family History:  The patient's family history includes Cancer - Lung (age of onset: 90) in his father.   ROS:   Please see the history of present illness.    ROS All other systems reviewed and are negative.   PHYSICAL EXAM:   VS:  BP 133/81   Pulse 86   Resp 16   Ht _0  (1.854 m)   Wt 239 lb (108.4 kg)   SpO2 100%   BMI 31.53 kg/m    GEN: Well nourished, well developed, in no acute distress  HEENT: normal  Neck: no JVD, carotid bruits, or masses Cardiac: RRR; no murmurs, rubs, or gallops,no edema  Respiratory:  clear to auscultation bilaterally, normal work of breathing GI: soft, nontender, nondistended, + BS MS: no  deformity or atrophy  Skin: warm and dry, no rash Neuro:  Alert and Oriented x 3, Strength and sensation are intact Psych: euthymic mood, full affect  Wt Readings from Last 3 Encounters:  11/17/18 239 lb (108.4 kg)  11/07/18 229 lb 15 oz (104.3 kg)  11/05/18 230 lb (104.3 kg)      Studies/Labs Reviewed:   EKG:  EKG is not ordered today.    Recent Labs: 11/07/2018: TSH 1.335 11/09/2018: ALT 25; BUN 27; Creatinine, Ser  1.10; Hemoglobin 13.8; Platelets 200; Potassium 4.1; Sodium 135   Lipid Panel    Component Value Date/Time   CHOL 143 11/07/2018 0811   TRIG 34 11/07/2018 0811   HDL 46 11/07/2018 0811   CHOLHDL 3.1 11/07/2018 0811   VLDL 7 11/07/2018 0811   LDLCALC 90 11/07/2018 0811    Additional studies/ records that were reviewed today include:   Echo 11/08/2018 LV EF: 55% -   60% Study Conclusions  - Left ventricle: The cavity size was normal. Wall thickness was   increased in a pattern of mild LVH. Systolic function was normal.   The estimated ejection fraction was in the range of 55% to 60%.   Wall motion was normal; there were no regional wall motion   abnormalities. Left ventricular diastolic function parameters   were normal. - Right ventricle: The cavity size was mildly dilated. Wall   thickness was normal. - Pericardium, extracardiac: A small, free-flowing pericardial   effusion was identified circumferential to the heart. There was   no evidence of hemodynamic compromise.     ASSESSMENT:    1. Pericarditis, unspecified chronicity, unspecified type   2. Essential hypertension   3. Tobacco abuse      PLAN:  In order of problems listed above:  1. Pericarditis: Unclear cause, thyroid was normal, no prior history of TB, rheumatoid arthritis or lupus.  Did have a flu shot a week prior to hospitalization, however no flulike symptoms.  He has completed 1 week course of high-dose ibuprofen, continue to have very mild chest discomfort, symptom has  improved.  I will do another week of naproxen.  He says he has significant GI upset with ibuprofen.  He will complete a 72-monthcourse of colchicine.  During this time, if his symptoms worsens, we plan to obtain limited echo to take another look at pericardial effusion.  2. Hypertension: Blood pressure stable on current therapy, however patient complaining of significant dizziness on the Toprol-XL 50 mg daily, will reduce Toprol-XL to 25 mg daily.    Medication Adjustments/Labs and Tests Ordered: Current medicines are reviewed at length with the patient today.  Concerns regarding medicines are outlined above.  Medication changes, Labs and Tests ordered today are listed in the Patient Instructions below. Patient Instructions  Medication Instructions:  DECREASE Metoprolol Succinate to 232mONCE A DAY STOP Ibuprofen START Aleve 50063maily for 7 DAYS ONLY-NOT A LONG TERM MEDICATION  If you need a refill on your cardiac medications before your next appointment, please call your pharmacy.   Lab work: None  If you have labs (blood work) drawn today and your tests are completely normal, you will receive your results only by: . MMarland KitchenChart Message (if you have MyChart) OR . A paper copy in the mail If you have any lab test that is abnormal or we need to change your treatment, we will call you to review the results.  Testing/Procedures: None   Follow-Up: [facility] and your health needs are our priority.  As part of our continuing mission to provide you with exceptional heart care, we have created designated Provider Care Teams.  These Care Teams include your primary Cardiologist (physician) and Advanced Practice Providers (APPs -  Physician Assistants and Nurse Practitioners) who all work together to provide you with the care you need, when you need it. . Your physician recommends that you schedule a follow-up appointment in: 2-3 months with Dr SkaMarlou PorchAny Other Special Instructions  Will Be  Listed Below (If Applicable).      Hilbert Corrigan, [place]  11/17/2018 8:58 AM    [address], [place], [place]  [address] Phone: [phone]; Fax: [phone]

## 2018-11-17 NOTE — Patient Instructions (Addendum)
Medication Instructions:  DECREASE Metoprolol Succinate to 25mg  ONCE A DAY STOP Ibuprofen START Aleve 500mg  daily for 7 DAYS ONLY-NOT A LONG TERM MEDICATION  If you need a refill on your cardiac medications before your next appointment, please call your pharmacy.   Lab work: None  If you have labs (blood work) drawn today and your tests are completely normal, you will receive your results only by: Marland Kitchen. MyChart Message (if you have MyChart) OR . A paper copy in the mail If you have any lab test that is abnormal or we need to change your treatment, we will call you to review the results.  Testing/Procedures: None   Follow-Up: At [facility], you and your health needs are our priority.  As part of our continuing mission to provide you with exceptional heart care, we have created designated Provider Care Teams.  These Care Teams include your primary Cardiologist (physician) and Advanced Practice Providers (APPs -  Physician Assistants and Nurse Practitioners) who all work together to provide you with the care you need, when you need it. . Your physician recommends that you schedule a follow-up appointment in: 2-3 months with Dr Anne FuSkains   Any Other Special Instructions Will Be Listed Below (If Applicable).

## 2019-01-26 ENCOUNTER — Encounter: Payer: Self-pay | Admitting: Cardiology

## 2019-02-17 ENCOUNTER — Ambulatory Visit: Payer: BLUE CROSS/BLUE SHIELD | Admitting: Cardiology

## 2019-10-17 ENCOUNTER — Other Ambulatory Visit: Payer: Self-pay

## 2019-10-17 ENCOUNTER — Emergency Department (HOSPITAL_BASED_OUTPATIENT_CLINIC_OR_DEPARTMENT_OTHER): Payer: BC Managed Care – PPO

## 2019-10-17 ENCOUNTER — Emergency Department (HOSPITAL_BASED_OUTPATIENT_CLINIC_OR_DEPARTMENT_OTHER)
Admission: EM | Admit: 2019-10-17 | Discharge: 2019-10-17 | Disposition: A | Discharge status: Home / Self Care | Payer: BC Managed Care – PPO | Attending: Emergency Medicine | Admitting: Emergency Medicine

## 2019-10-17 ENCOUNTER — Encounter (HOSPITAL_BASED_OUTPATIENT_CLINIC_OR_DEPARTMENT_OTHER): Payer: Self-pay | Admitting: *Deleted

## 2019-10-17 DIAGNOSIS — Z79899 Other long term (current) drug therapy: Secondary | ICD-10-CM | POA: Insufficient documentation

## 2019-10-17 DIAGNOSIS — I10 Essential (primary) hypertension: Secondary | ICD-10-CM | POA: Diagnosis not present

## 2019-10-17 DIAGNOSIS — R42 Dizziness and giddiness: Secondary | ICD-10-CM | POA: Diagnosis not present

## 2019-10-17 DIAGNOSIS — F17290 Nicotine dependence, other tobacco product, uncomplicated: Secondary | ICD-10-CM | POA: Insufficient documentation

## 2019-10-17 LAB — CBG MONITORING, ED
Glucose-Capillary: 56 mg/dL — ABNORMAL LOW (ref 70–99)
Glucose-Capillary: 87 mg/dL (ref 70–99)

## 2019-10-17 LAB — CBC WITH DIFFERENTIAL/PLATELET
Abs Immature Granulocytes: 0.03 10*3/uL (ref 0.00–0.07)
Basophils Absolute: 0.1 10*3/uL (ref 0.0–0.1)
Basophils Relative: 1 %
Eosinophils Absolute: 0.2 10*3/uL (ref 0.0–0.5)
Eosinophils Relative: 2 %
HCT: 51.9 % (ref 39.0–52.0)
Hemoglobin: 16.9 g/dL (ref 13.0–17.0)
Immature Granulocytes: 0 %
Lymphocytes Relative: 17 %
Lymphs Abs: 1.7 10*3/uL (ref 0.7–4.0)
MCH: 29.8 pg (ref 26.0–34.0)
MCHC: 32.6 g/dL (ref 30.0–36.0)
MCV: 91.5 fL (ref 80.0–100.0)
Monocytes Absolute: 0.7 10*3/uL (ref 0.1–1.0)
Monocytes Relative: 7 %
Neutro Abs: 7.5 10*3/uL (ref 1.7–7.7)
Neutrophils Relative %: 73 %
Platelets: 196 10*3/uL (ref 150–400)
RBC: 5.67 MIL/uL (ref 4.22–5.81)
RDW: 13.3 % (ref 11.5–15.5)
WBC: 10.2 10*3/uL (ref 4.0–10.5)
nRBC: 0 % (ref 0.0–0.2)

## 2019-10-17 LAB — COMPREHENSIVE METABOLIC PANEL
ALT: 21 U/L (ref 0–44)
AST: 22 U/L (ref 15–41)
Albumin: 4.1 g/dL (ref 3.5–5.0)
Alkaline Phosphatase: 68 U/L (ref 38–126)
Anion gap: 9 (ref 5–15)
BUN: 27 mg/dL — ABNORMAL HIGH (ref 6–20)
CO2: 24 mmol/L (ref 22–32)
Calcium: 9.1 mg/dL (ref 8.9–10.3)
Chloride: 103 mmol/L (ref 98–111)
Creatinine, Ser: 0.9 mg/dL (ref 0.61–1.24)
GFR calc Af Amer: >60 mL/min (ref >60)
GFR calc non Af Amer: >60 mL/min (ref >60)
Glucose, Bld: 108 mg/dL — ABNORMAL HIGH (ref 70–99)
Potassium: 4.1 mmol/L (ref 3.5–5.1)
Sodium: 136 mmol/L (ref 135–145)
Total Bilirubin: 0.8 mg/dL (ref 0.3–1.2)
Total Protein: 7.3 g/dL (ref 6.5–8.1)

## 2019-10-17 MED ORDER — MECLIZINE HCL 25 MG PO TABS: 25.0000 mg | ORAL_TABLET | Freq: Three times a day (TID) | ORAL | 0 refills | Status: AC | PRN

## 2019-10-17 MED ORDER — SODIUM CHLORIDE 0.9 % IV BOLUS
1000.0000 mL | Freq: Once | INTRAVENOUS | Status: AC
  Administered 2019-10-17: 1000 mL via INTRAVENOUS

## 2019-10-17 MED ORDER — MECLIZINE HCL 25 MG PO TABS
25.0000 mg | ORAL_TABLET | Freq: Once | ORAL | Status: AC
  Administered 2019-10-17: 25 mg via ORAL
  Filled 2019-10-17: qty 1

## 2019-10-17 MED FILL — MECLIZINE 25 MG TABLET: 25 | 10 days supply | Qty: 30 | Fill #0

## 2019-10-17 NOTE — Discharge Instructions (Signed)
Follow up with your primary care provider. Return to ED for worsening symptoms, increased blurry vision, numbness in arms or legs, chest pain or shortness of breath.

## 2019-10-17 NOTE — ED Triage Notes (Signed)
Working, became lightheaded and diaphoretic. Denies pain or SOB.

## 2019-10-17 NOTE — ED Notes (Signed)
Date and time results received: 10/17/19 1325 (use smartphrase ".now" to insert current time)  Test: CBG Critical Value56  Name of Provider Notified: Hina, PA  Orders Received? Or Actions Taken?: [place]

## 2019-10-17 NOTE — ED Provider Notes (Signed)
[facility] EMERGENCY DEPARTMENT Provider Note   CSN: [id number] Arrival date & time: 10/17/19  1230     History   Chief Complaint Chief Complaint  Patient presents with  . Dizziness    HPI Mathew Gonzalez is a 53 y.o. male with a past medical history of HTN, bipolar disorder, who presents to ED for evaluation of dizziness, lightheadedness, diaphoresis that began approximately 1 hour ago while at work.  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  Symptoms have gradually improved since they began without specific factor although he did eat something.  Unsure if he has had similar episodes in the past but was told that he could have vertigo.  He denies any previous or current chest pain, headache, blurry vision, syncope, numbness in arms or legs, shortness of breath, fever, vomiting or diarrhea.  Reports eating a normal meal today.  Denies history of diabetes.      HPI  Past Medical History:  Diagnosis Date  . Arthritis    osteoarthritis, on chronic hydrocodone   . Bipolar disorder ([place])   . Hypertension    not on medication    Patient Active Problem List   Diagnosis Date Noted  . Chest pain 11/07/2018  . Hyperglycemia 11/07/2018  . Leukocytosis 11/07/2018  . Tobacco dependence 11/07/2018  . Tachycardia 05/04/2014  . ALLERGIC RHINITIS 08/21/2009  . INSOMNIA 08/21/2009  . HEADACHE, CHRONIC 08/21/2009  . BIPOLAR DISORDER UNSPECIFIED 08/20/2009  . Essential hypertension 08/20/2009  . UNSPECIFIED FUNCTIONAL DISORDER OF STOMACH 08/20/2009  . ALCOHOL ABUSE, HX OF 08/20/2009  . NEPHROLITHIASIS, HX OF 08/20/2009  . KNEE REPLACEMENT, HX OF 08/20/2009  . Other postprocedural status(V45.89) 08/20/2009    Past Surgical History:  Procedure Laterality Date  . REPLACEMENT TOTAL KNEE    . ROTATOR CUFF REPAIR          Home Medications    Prior to Admission medications   Medication Sig Start Date  End Date Taking? Authorizing Provider  HYDROcodone-acetaminophen (NORCO/VICODIN) 5-325 MG tablet Take 1 tablet by mouth 2 (two) times daily as needed for moderate pain.   Yes [provider]  testosterone cypionate (DEPOTESTOTERONE CYPIONATE) 100 MG/ML injection Inject 200 mg into the muscle every 14 (fourteen) days. For IM use only   Yes [provider]  zolpidem (AMBIEN) 10 MG tablet Take 5 mg by mouth at bedtime as needed for sleep.   Yes [provider]  colchicine 0.6 MG tablet Take 1 tablet (0.6 mg total) by mouth 2 (two) times daily. 11/09/18 02/07/19  Amin, Jeanella Flattery, MD  meclizine (ANTIVERT) 25 MG tablet Take 1 tablet (25 mg total) by mouth 3 (three) times daily as needed for dizziness. 10/17/19   Nikeya Maxim, PA-C  metoprolol succinate (TOPROL-XL) 25 MG 24 hr tablet Take 1 tablet (25 mg total) by mouth daily. Take with or immediately following a meal. 11/17/18 12/17/18  Almyra Deforest, PA  pantoprazole (PROTONIX) 40 MG tablet Take 1 tablet (40 mg total) by mouth daily before breakfast. 11/09/18 12/09/18  Damita Lack, MD    Family History Family History  Problem Relation Age of Onset  . Cancer - Lung Father 58  . CAD Neg Hx     Social History Social History   Tobacco Use  . Smoking status: Current Every Day Smoker    Types: Pipe  . Smokeless tobacco: Never Used  Substance Use Topics  . Alcohol  use: Not Currently  . Drug use: No    Types: Marijuana    Comment: remote history     Allergies   Prednisone, Ivp dye [iodinated diagnostic agents], and Metrizamide   Review of Systems Review of Systems  Constitutional: Positive for diaphoresis. Negative for appetite change, chills and fever.  HENT: Negative for ear pain, rhinorrhea, sneezing and sore throat.   Eyes: Negative for photophobia and visual disturbance.  Respiratory: Negative for cough, chest tightness, shortness of breath and wheezing.   Cardiovascular: Negative for chest pain and  palpitations.  Gastrointestinal: Negative for abdominal pain, blood in stool, constipation, diarrhea, nausea and vomiting.  Genitourinary: Negative for dysuria, hematuria and urgency.  Musculoskeletal: Negative for myalgias.  Skin: Negative for rash.  Neurological: Positive for dizziness and light-headedness. Negative for weakness.     Physical Exam Updated Vital Signs BP 114/82   Pulse (!) 56   Temp 97.9 F (36.6 C) (Oral)   Resp 14   Ht  (1.854 m)   Wt 104.3 kg   SpO2 100%   BMI 30.34 kg/m   Physical Exam Vitals signs and nursing note reviewed.  Constitutional:      General: He is not in acute distress.    Appearance: He is well-developed.  HENT:     Head: Normocephalic and atraumatic.     Nose: Nose normal.  Eyes:     General: No scleral icterus.       Right eye: No discharge.        Left eye: No discharge.     Conjunctiva/sclera: Conjunctivae normal.     Pupils: Pupils are equal, round, and reactive to light.  Neck:     Musculoskeletal: Normal range of motion and neck supple.  Cardiovascular:     Rate and Rhythm: Normal rate and regular rhythm.     Heart sounds: Normal heart sounds. No murmur. No friction rub. No gallop.   Pulmonary:     Effort: Pulmonary effort is normal. No respiratory distress.     Breath sounds: Normal breath sounds.  Abdominal:     General: Bowel sounds are normal. There is no distension.     Palpations: Abdomen is soft.     Tenderness: There is no abdominal tenderness. There is no guarding.  Musculoskeletal: Normal range of motion.     Right lower leg: No edema.     Left lower leg: No edema.  Skin:    General: Skin is warm and dry.     Findings: No rash.  Neurological:     General: No focal deficit present.     Mental Status: He is alert and oriented to person, place, and time.     Cranial Nerves: No cranial nerve deficit.     Sensory: No sensory deficit.     Motor: No weakness or abnormal muscle tone.     Coordination:  Coordination normal.     Comments: Pupils reactive. No facial asymmetry noted. Cranial nerves appear grossly intact. Sensation intact to light touch on face, BUE and BLE. Strength 5/5 in BUE and BLE.      ED Treatments / Results  Labs (all labs ordered are listed, but only abnormal results are displayed) Labs Reviewed  COMPREHENSIVE METABOLIC PANEL - Abnormal; Notable for the following components:      Result Value   Glucose, Bld 108 (*)    BUN 27 (*)    All other components within normal limits  CBG MONITORING, ED - Abnormal; Notable for  the following components:   Glucose-Capillary 56 (*)    All other components within normal limits  CBC WITH DIFFERENTIAL/PLATELET  CBG MONITORING, ED    EKG EKG Interpretation  Date/Time:  Tuesday October 17 2019 12:49:12 EDT Ventricular Rate:  54 PR Interval:    QRS Duration: 120 QT Interval:  421 QTC Calculation: 399 R Axis:   -30 Text Interpretation:  Sinus rhythm Incomplete RBBB and LAFB Confirmed by Kennis Carina [phone]) on 10/17/2019 1:58:11 PM   Radiology Dg Chest 2 View  Result Date: 10/17/2019 CLINICAL DATA:  Lightheadedness and diaphoresis EXAM: CHEST - 2 VIEW COMPARISON:  11/07/2018 FINDINGS: Cardiac shadow is within normal limits. The lungs are well aerated bilaterally. No focal infiltrate or sizable effusion is noted. Mild degenerative changes of the thoracic spine are seen. IMPRESSION: No acute abnormality noted. Electronically Signed   By: Alcide Clever M.D.   On: 10/17/2019 13:17    Procedures Procedures (including critical care time)  Medications Ordered in ED Medications  sodium chloride 0.9 % bolus 1,000 mL (0 mLs Intravenous Stopped 10/17/19 1435)  meclizine (ANTIVERT) tablet 25 mg (25 mg Oral Given 10/17/19 1312)     Initial Impression / Assessment and Plan / ED Course  I have reviewed the triage vital signs and the nursing notes.  Pertinent labs & imaging results that were available during my care of the  patient were reviewed by me and considered in my medical decision making (see chart for details).        53yo M who presents to ED for evaluation of dizziness, lightheadedness, diaphoresis that began approximately 1hr prior to arrival while at work. 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 He reports feeling like the room is spinning around him. Unsure if he has been diagnosed with vertigo in the past but he believes so. Does not take any medication for this. Denies chest pain, shortness of breath, headache, vision changes, vomiting, numbness in arms or legs or slurred speech. Gradually improving after eating a snack at work. Here vital signs are reassuring. No deficits in neurological exam noted. No weakness, numbness or facial asymmetry noted. EKG without acute ischemic changes. Labwork unremarkable with the exception of CBG of 56; given food and drink which he is tolerating. Given meclizine and IV fluids with improvement in symptoms. CBG improved. Will tx with meclizine for vertigo.  He has a follow-up appointment with his PCP today. Return for worsening symptoms.  Patient is hemodynamically stable, in NAD, and able to ambulate in the ED. Evaluation does not show pathology that would require ongoing emergent intervention or inpatient treatment. I explained the diagnosis to the patient. Pain has been managed and has no complaints prior to discharge. Patient is comfortable with above plan and is stable for discharge at this time. All questions were answered prior to disposition. Strict return precautions for returning to the ED were discussed. Encouraged follow up with PCP.   An After Visit Summary was printed and given to the patient.   Portions of this note were generated with Scientist, clinical (histocompatibility and immunogenetics). Dictation errors may occur despite best attempts at proofreading.   Final Clinical Impressions(s) / ED Diagnoses   Final diagnoses:   Vertigo    ED Discharge Orders         Ordered    meclizine (ANTIVERT) 25 MG tablet  3 times daily PRN     10/17/19 1449  Dietrich Pates, PA-C 10/17/19 1451    Sabas Sous, MD 10/20/19 [phone]

## 2019-10-17 NOTE — ED Notes (Signed)
Pt lying down for 10mins for orthostatics 

## 2019-10-17 NOTE — ED Notes (Signed)
RN Elba Barman informed of Pts CBG reading

## 2020-01-03 ENCOUNTER — Other Ambulatory Visit: Payer: Self-pay

## 2020-01-03 ENCOUNTER — Emergency Department (HOSPITAL_BASED_OUTPATIENT_CLINIC_OR_DEPARTMENT_OTHER): Payer: BC Managed Care – PPO

## 2020-01-03 ENCOUNTER — Encounter (HOSPITAL_BASED_OUTPATIENT_CLINIC_OR_DEPARTMENT_OTHER): Payer: Self-pay | Admitting: *Deleted

## 2020-01-03 ENCOUNTER — Emergency Department (HOSPITAL_BASED_OUTPATIENT_CLINIC_OR_DEPARTMENT_OTHER)
Admission: EM | Admit: 2020-01-03 | Discharge: 2020-01-04 | Disposition: A | Discharge status: Home / Self Care | Payer: BC Managed Care – PPO | Attending: Emergency Medicine | Admitting: Emergency Medicine

## 2020-01-03 DIAGNOSIS — R0602 Shortness of breath: Secondary | ICD-10-CM | POA: Diagnosis present

## 2020-01-03 DIAGNOSIS — I10 Essential (primary) hypertension: Secondary | ICD-10-CM | POA: Diagnosis not present

## 2020-01-03 DIAGNOSIS — Z96659 Presence of unspecified artificial knee joint: Secondary | ICD-10-CM | POA: Diagnosis not present

## 2020-01-03 DIAGNOSIS — F17290 Nicotine dependence, other tobacco product, uncomplicated: Secondary | ICD-10-CM | POA: Diagnosis not present

## 2020-01-03 DIAGNOSIS — Z79899 Other long term (current) drug therapy: Secondary | ICD-10-CM | POA: Insufficient documentation

## 2020-01-03 DIAGNOSIS — B349 Viral infection, unspecified: Secondary | ICD-10-CM | POA: Insufficient documentation

## 2020-01-03 DIAGNOSIS — Z20822 Contact with and (suspected) exposure to covid-19: Secondary | ICD-10-CM | POA: Insufficient documentation

## 2020-01-03 HISTORY — DX: Alcohol dependence, in remission: F10.21

## 2020-01-03 HISTORY — DX: Opioid dependence, in remission: F11.21

## 2020-01-03 LAB — RAPID INFLUENZA A&B ANTIGENS
Influenza A (ARMC): NEGATIVE
Influenza B (ARMC): NEGATIVE

## 2020-01-03 LAB — SARS CORONAVIRUS 2 AG (30 MIN TAT): SARS Coronavirus 2 Ag: NEGATIVE

## 2020-01-03 MED ORDER — ACETAMINOPHEN 325 MG PO TABS
650.0000 mg | ORAL_TABLET | Freq: Once | ORAL | Status: AC
  Administered 2020-01-03: 19:00:00 650 mg via ORAL
  Filled 2020-01-03: qty 2

## 2020-01-03 NOTE — ED Provider Notes (Signed)
[facility] EMERGENCY DEPARTMENT Provider Note  CSN: [id number] Arrival date & time: 01/03/20 1902  Chief Complaint(s) Shortness of Breath  HPI ARIEN Gonzalez is a 54 y.o. male   The history is provided by the patient.  Fever Max temp prior to arrival:  102.3 Temp source:  Oral Severity:  Moderate Onset quality:  Gradual Duration:  1 day Timing:  Constant Progression:  Waxing and waning Chronicity:  New Relieved by:  Acetaminophen Worsened by:  Nothing Associated symptoms: chills, congestion, cough, headaches, myalgias and rhinorrhea   Associated symptoms: no diarrhea, no nausea, no sore throat and no vomiting     Past Medical History Past Medical History:  Diagnosis Date  . Arthritis    osteoarthritis, on chronic hydrocodone   . Bipolar disorder ([facility])   . Hypertension    not on medication   Patient Active Problem List   Diagnosis Date Noted  . Chest pain 11/07/2018  . Hyperglycemia 11/07/2018  . Leukocytosis 11/07/2018  . Tobacco dependence 11/07/2018  . Tachycardia 05/04/2014  . ALLERGIC RHINITIS 08/21/2009  . INSOMNIA 08/21/2009  . HEADACHE, CHRONIC 08/21/2009  . BIPOLAR DISORDER UNSPECIFIED 08/20/2009  . Essential hypertension 08/20/2009  . UNSPECIFIED FUNCTIONAL DISORDER OF STOMACH 08/20/2009  . ALCOHOL ABUSE, HX OF 08/20/2009  . NEPHROLITHIASIS, HX OF 08/20/2009  . KNEE REPLACEMENT, HX OF 08/20/2009  . Other postprocedural status(V45.89) 08/20/2009   Home Medication(s) Prior to Admission medications   Medication Sig Start Date End Date Taking? Authorizing Provider  colchicine 0.6 MG tablet Take 1 tablet (0.6 mg total) by mouth 2 (two) times daily. 11/09/18 02/07/19  Amin, Loura Halt, MD  HYDROcodone-acetaminophen (NORCO/VICODIN) 5-325 MG tablet Take 1 tablet by mouth 2 (two) times daily as needed for moderate pain.    [provider]  meclizine (ANTIVERT) 25 MG tablet Take 1 tablet (25 mg total) by mouth 3 (three) times daily as  needed for dizziness. 10/17/19   Khatri, Hina, PA-C  metoprolol succinate (TOPROL-XL) 25 MG 24 hr tablet Take 1 tablet (25 mg total) by mouth daily. Take with or immediately following a meal. 11/17/18 12/17/18  Azalee Course, PA  pantoprazole (PROTONIX) 40 MG tablet Take 1 tablet (40 mg total) by mouth daily before breakfast. 11/09/18 12/09/18  Amin, Loura Halt, MD  testosterone cypionate (DEPOTESTOTERONE CYPIONATE) 100 MG/ML injection Inject 200 mg into the muscle every 14 (fourteen) days. For IM use only    [provider]  zolpidem (AMBIEN) 10 MG tablet Take 5 mg by mouth at bedtime as needed for sleep.    [provider]                                                                                                                                    Past Surgical History Past Surgical History:  Procedure Laterality Date  . REPLACEMENT TOTAL KNEE    . ROTATOR CUFF REPAIR     Family  History Family History  Problem Relation Age of Onset  . Cancer - Lung Father 67  . CAD Neg Hx     Social History Social History   Tobacco Use  . Smoking status: Current Every Day Smoker    Types: Pipe  . Smokeless tobacco: Never Used  Substance Use Topics  . Alcohol use: Not Currently  . Drug use: No    Types: Marijuana    Comment: remote history   Allergies Prednisone, Ivp dye [iodinated diagnostic agents], and Metrizamide  Review of Systems Review of Systems  Constitutional: Positive for chills and fever.  HENT: Positive for congestion and rhinorrhea. Negative for sore throat.   Respiratory: Positive for cough.   Gastrointestinal: Negative for diarrhea, nausea and vomiting.  Musculoskeletal: Positive for myalgias.  Neurological: Positive for headaches.   All other systems are reviewed and are negative for acute change except as noted in the HPI  Physical Exam Vital Signs  I have reviewed the triage vital signs BP 107/72   Pulse (!) 105   Temp 98.6 F (37 C) (Oral)    Resp 19   Ht 6\' 1"  (1.854 m)   Wt 104.3 kg   SpO2 98%   BMI 30.34 kg/m   Physical Exam Vitals reviewed.  Constitutional:      General: He is not in acute distress.    Appearance: He is well-developed. He is not diaphoretic.  HENT:     Head: Normocephalic and atraumatic.     Nose: Nose normal.  Eyes:     General: No scleral icterus.       Right eye: No discharge.        Left eye: No discharge.     Conjunctiva/sclera: Conjunctivae normal.     Pupils: Pupils are equal, round, and reactive to light.  Cardiovascular:     Rate and Rhythm: Regular rhythm. Tachycardia present.     Heart sounds: No murmur. No friction rub. No gallop.   Pulmonary:     Effort: Pulmonary effort is normal. No respiratory distress.     Breath sounds: Normal breath sounds. No stridor. No rales.  Abdominal:     General: There is no distension.     Palpations: Abdomen is soft.     Tenderness: There is no abdominal tenderness.  Musculoskeletal:        General: No tenderness.     Cervical back: Normal range of motion and neck supple.  Skin:    General: Skin is warm and dry.     Findings: No erythema or rash.  Neurological:     Mental Status: He is alert and oriented to person, place, and time.     ED Results and Treatments Labs (all labs ordered are listed, but only abnormal results are displayed) Labs Reviewed  SARS CORONAVIRUS 2 AG (30 MIN TAT)  RAPID INFLUENZA A&B ANTIGENS  EKG  EKG Interpretation  Date/Time:    Ventricular Rate:    PR Interval:    QRS Duration:   QT Interval:    QTC Calculation:   R Axis:     Text Interpretation:        Radiology DG Chest Port 1 View  Result Date: 01/03/2020 CLINICAL DATA:  Cough and shortness of breath. EXAM: PORTABLE CHEST 1 VIEW COMPARISON:  October 17, 2019 FINDINGS: The heart size and mediastinal contours are within normal  limits. Both lungs are clear. The visualized skeletal structures are unremarkable. IMPRESSION: No active disease. Electronically Signed   By: Virgina Norfolk M.D.   On: 01/03/2020 22:49    Pertinent labs & imaging results that were available during my care of the patient were reviewed by me and considered in my medical decision making (see chart for details).  Medications Ordered in ED Medications  acetaminophen (TYLENOL) tablet 650 mg (650 mg Oral Given 01/03/20 1914)                                                                                                                                    Procedures Procedures  (including critical care time)  Medical Decision Making / ED Course I have reviewed the nursing notes for this encounter and the patient's prior records (if available in EHR or on provided paperwork).   Mathew Gonzalez was evaluated in Emergency Department on 01/03/2020 for the symptoms described in the history of present illness. He was evaluated in the context of the global COVID-19 pandemic, which necessitated consideration that the patient might be at risk for infection with the SARS-CoV-2 virus that causes COVID-19. Institutional protocols and algorithms that pertain to the evaluation of patients at risk for COVID-19 are in a state of rapid change based on information released by regulatory bodies including the CDC and federal and state organizations. These policies and algorithms were followed during the patient's care in the ED.  Patient presents with viral symptoms for 1 day. adequate oral hydration. Rest of history as above.  Patient appears well. No signs of toxicity, patient is interactive. No hypoxia, tachypnea or other signs of respiratory distress. No sign of clinical dehydration. Lung exam clear. Rest of exam as above.  CXR negative.  Most consistent with viral illness   Rapid flu and COVID negative. He was test at [facility] and pending.  No evidence suggestive of  pharyngitis, AOM, PNA, or meningitis.   Discussed symptomatic treatment with the patient and they will follow closely with their PCP.        Final Clinical Impression(s) / ED Diagnoses Final diagnoses:  Viral syndrome    The patient appears reasonably screened and/or stabilized for discharge and I doubt any other medical condition or other [facility] requiring further screening, evaluation, or treatment in the ED at this time prior to discharge.  Disposition: Discharge  Condition: Good  I have discussed the  results, Dx and Tx plan with the patient who expressed understanding and agree(s) with the plan. Discharge instructions discussed at great length. The patient was given strict return precautions who verbalized understanding of the instructions. No further questions at time of discharge.    ED Discharge Orders    None      Follow Up: Salli Real, MD [address] [place] [address] [phone]  Schedule an appointment as soon as possible for a visit  As needed      This chart was dictated using voice recognition software.  Despite best efforts to proofread,  errors can occur which can change the documentation meaning.   Nira Conn, MD 01/03/20 2330

## 2020-01-03 NOTE — ED Triage Notes (Signed)
Per EMS:  Pt states he became sob at 1400.  Tested for covid at 1700 which was negative.  Pt has become more sob, fever, chills.

## 2020-01-03 NOTE — ED Notes (Signed)
Pt placed in gown, 5 lead and pulse ox

## 2020-01-03 NOTE — ED Triage Notes (Signed)
Pt brought oin by EMS from home , c/o SOb , fever , body aches , cough , covid test today waiting for results

## 2020-01-03 NOTE — Discharge Instructions (Signed)
You may take over-the-counter medicine for symptomatic relief, such as Tylenol,TheraFlu, Alka seltzer , black elderberry, etc. Please limit acetaminophen (Tylenol) to 4000 mg for a 24hr period. Please note that other over-the-counter medicine may contain acetaminophen or ibuprofen as a component of their ingredients.

## 2021-07-31 ENCOUNTER — Other Ambulatory Visit (HOSPITAL_BASED_OUTPATIENT_CLINIC_OR_DEPARTMENT_OTHER): Payer: Self-pay

## 2021-07-31 ENCOUNTER — Encounter (HOSPITAL_BASED_OUTPATIENT_CLINIC_OR_DEPARTMENT_OTHER): Payer: Self-pay | Admitting: Emergency Medicine

## 2021-07-31 ENCOUNTER — Emergency Department (HOSPITAL_BASED_OUTPATIENT_CLINIC_OR_DEPARTMENT_OTHER)
Admission: EM | Admit: 2021-07-31 | Discharge: 2021-07-31 | Disposition: A | Discharge status: Home / Self Care | Payer: BC Managed Care – PPO | Attending: Emergency Medicine | Admitting: Emergency Medicine

## 2021-07-31 ENCOUNTER — Other Ambulatory Visit: Payer: Self-pay

## 2021-07-31 ENCOUNTER — Emergency Department (HOSPITAL_BASED_OUTPATIENT_CLINIC_OR_DEPARTMENT_OTHER): Payer: BC Managed Care – PPO

## 2021-07-31 DIAGNOSIS — I10 Essential (primary) hypertension: Secondary | ICD-10-CM | POA: Insufficient documentation

## 2021-07-31 DIAGNOSIS — F17200 Nicotine dependence, unspecified, uncomplicated: Secondary | ICD-10-CM | POA: Insufficient documentation

## 2021-07-31 DIAGNOSIS — M5432 Sciatica, left side: Secondary | ICD-10-CM | POA: Diagnosis not present

## 2021-07-31 DIAGNOSIS — M5431 Sciatica, right side: Secondary | ICD-10-CM

## 2021-07-31 DIAGNOSIS — M79604 Pain in right leg: Secondary | ICD-10-CM

## 2021-07-31 DIAGNOSIS — M79605 Pain in left leg: Secondary | ICD-10-CM | POA: Insufficient documentation

## 2021-07-31 DIAGNOSIS — Z79899 Other long term (current) drug therapy: Secondary | ICD-10-CM | POA: Insufficient documentation

## 2021-07-31 DIAGNOSIS — Z96659 Presence of unspecified artificial knee joint: Secondary | ICD-10-CM | POA: Diagnosis not present

## 2021-07-31 LAB — URINALYSIS, ROUTINE W REFLEX MICROSCOPIC
Bilirubin Urine: NEGATIVE
Glucose, UA: NEGATIVE mg/dL
Hgb urine dipstick: NEGATIVE
Ketones, ur: NEGATIVE mg/dL
Leukocytes,Ua: NEGATIVE
Nitrite: NEGATIVE
Protein, ur: NEGATIVE mg/dL
Specific Gravity, Urine: 1.015 (ref 1.005–1.030)
pH: 6.5 (ref 5.0–8.0)

## 2021-07-31 LAB — C-REACTIVE PROTEIN: CRP: 0.9 mg/dL (ref <1.0)

## 2021-07-31 LAB — BASIC METABOLIC PANEL
Anion gap: 7 (ref 5–15)
BUN: 22 mg/dL — ABNORMAL HIGH (ref 6–20)
CO2: 30 mmol/L (ref 22–32)
Calcium: 9.6 mg/dL (ref 8.9–10.3)
Chloride: 103 mmol/L (ref 98–111)
Creatinine, Ser: 0.98 mg/dL (ref 0.61–1.24)
GFR, Estimated: >60 mL/min (ref >60)
Glucose, Bld: 91 mg/dL (ref 70–99)
Potassium: 4.4 mmol/L (ref 3.5–5.1)
Sodium: 140 mmol/L (ref 135–145)

## 2021-07-31 LAB — CBC WITH DIFFERENTIAL/PLATELET
Abs Immature Granulocytes: 0.03 10*3/uL (ref 0.00–0.07)
Basophils Absolute: 0.1 10*3/uL (ref 0.0–0.1)
Basophils Relative: 1 %
Eosinophils Absolute: 0.2 10*3/uL (ref 0.0–0.5)
Eosinophils Relative: 2 %
HCT: 53.5 % — ABNORMAL HIGH (ref 39.0–52.0)
Hemoglobin: 18.3 g/dL — ABNORMAL HIGH (ref 13.0–17.0)
Immature Granulocytes: 0 %
Lymphocytes Relative: 21 %
Lymphs Abs: 1.9 10*3/uL (ref 0.7–4.0)
MCH: 30.7 pg (ref 26.0–34.0)
MCHC: 34.2 g/dL (ref 30.0–36.0)
MCV: 89.6 fL (ref 80.0–100.0)
Monocytes Absolute: 0.6 10*3/uL (ref 0.1–1.0)
Monocytes Relative: 7 %
Neutro Abs: 6.2 10*3/uL (ref 1.7–7.7)
Neutrophils Relative %: 69 %
Platelets: 226 10*3/uL (ref 150–400)
RBC: 5.97 MIL/uL — ABNORMAL HIGH (ref 4.22–5.81)
RDW: 13.3 % (ref 11.5–15.5)
WBC: 8.9 10*3/uL (ref 4.0–10.5)
nRBC: 0 % (ref 0.0–0.2)

## 2021-07-31 LAB — TSH: TSH: 1.928 u[IU]/mL (ref 0.350–4.500)

## 2021-07-31 LAB — SEDIMENTATION RATE: Sed Rate: 2 mm/hr (ref 0–16)

## 2021-07-31 LAB — PHOSPHORUS: Phosphorus: 3.1 mg/dL (ref 2.5–4.6)

## 2021-07-31 LAB — CK: Total CK: 119 U/L (ref 49–397)

## 2021-07-31 LAB — MAGNESIUM: Magnesium: 2 mg/dL (ref 1.7–2.4)

## 2021-07-31 MED ORDER — METHOCARBAMOL 750 MG PO TABS
750.0000 mg | ORAL_TABLET | Freq: Four times a day (QID) | ORAL | 0 refills | Status: AC | PRN
  Filled 2021-07-31: qty 30, 8d supply, fill #0

## 2021-07-31 MED ORDER — IBUPROFEN 800 MG PO TABS
800.0000 mg | ORAL_TABLET | Freq: Once | ORAL | Status: AC
  Administered 2021-07-31: 800 mg via ORAL
  Filled 2021-07-31: qty 1

## 2021-07-31 MED ORDER — DEXAMETHASONE SODIUM PHOSPHATE 10 MG/ML IJ SOLN
10.0000 mg | Freq: Once | INTRAMUSCULAR | Status: AC
  Administered 2021-07-31: 10 mg via INTRAMUSCULAR
  Filled 2021-07-31: qty 1

## 2021-07-31 MED ORDER — METHOCARBAMOL 500 MG PO TABS
750.0000 mg | ORAL_TABLET | Freq: Once | ORAL | Status: AC
  Administered 2021-07-31: 750 mg via ORAL
  Filled 2021-07-31: qty 2

## 2021-07-31 NOTE — Discharge Instructions (Addendum)
1.  At this time, after evaluation for other causes, I suspect your leg pain is coming from low back problems with sciatica.  Review information concerning this diagnosis.  You have been given a shot of Decadron, a steroid in the emergency department to help with inflammation and pain.  You have also been given a prescription for Robaxin, a muscle relaxer to try to help your symptoms. 2.  Schedule follow-up appointment with your doctor or your orthopedic doctor for recheck.  If your symptoms are not significantly improved, you may need further diagnostic testing such as an MRI of the back.  Return to emergency department immediately if you get weakness or numbness into your legs, loss of control of your bowel or bladder or other concerning symptoms. 3.  Your hemoglobin level is just over the top of normal.  This has been the case previously as well.  Make sure your doctor is monitoring this condition.  It is often the result of smoking or other conditions that stimulate your body to carry additional oxygen to the tissues, rarely it is a condition called polycythemia vera.  Your doctor should monitor your blood counts to make sure you do not appear to have this condition.

## 2021-07-31 NOTE — ED Triage Notes (Signed)
Patient reports aching in BLE x 1 month. History of arthritis. States pain starts mid thigh and runs to feet. No feet edema noted. Skin WNL.

## 2021-07-31 NOTE — ED Provider Notes (Signed)
[facility] EMERGENCY DEPARTMENT Provider Note   CSN: [id number] Arrival date & time: 07/31/21  0848     History Chief Complaint  Patient presents with   Leg Pain    Mathew Gonzalez is a 55 y.o. male.  HPI Is been having problems with bilateral leg pain for over a month.  No specific injury.  He reports he has intense aching in his legs.  A lot of it is in the front of the legs and the thighs.  However pain can also radiate down into his calves and lower legs.  No specific burning or numbness.  Symptoms are pretty consistent throughout the day.  He does a lot of walking around at his job.  He reports is a little worse toward the end of the day but he is not limited in what he does and does not get a lot of immediate relief from rest.  He denies he suffers from much back pain.  He did take a fall about a week ago and strained his back but it is not caused him any severe pain.  He denies any weakness or numbness into the legs.  Patient reports he does urinate very frequently.  That is been "normal for him" for a long time.  He denies any burning.  No fevers no chills.    Past Medical History:  Diagnosis Date   Alcohol dependence in remission (HCC)    Arthritis    osteoarthritis, on chronic hydrocodone    Bipolar disorder (HCC)    Hypertension    not on medication   Opioid dependence in remission [facility])     Patient Active Problem List   Diagnosis Date Noted   Chest pain 11/07/2018   Hyperglycemia 11/07/2018   Leukocytosis 11/07/2018   Tobacco dependence 11/07/2018   Tachycardia 05/04/2014   ALLERGIC RHINITIS 08/21/2009   INSOMNIA 08/21/2009   HEADACHE, CHRONIC 08/21/2009   BIPOLAR DISORDER UNSPECIFIED 08/20/2009   Essential hypertension 08/20/2009   UNSPECIFIED FUNCTIONAL DISORDER OF STOMACH 08/20/2009   ALCOHOL ABUSE, HX OF 08/20/2009   NEPHROLITHIASIS, HX OF 08/20/2009   KNEE REPLACEMENT, HX OF 08/20/2009   Other postprocedural status(V45.89) 08/20/2009    Past  Surgical History:  Procedure Laterality Date   REPLACEMENT TOTAL KNEE     ROTATOR CUFF REPAIR         Family History  Problem Relation Age of Onset   Cancer - Lung Father 5   CAD Neg Hx     Social History   Tobacco Use   Smoking status: Every Day    Types: Pipe   Smokeless tobacco: Never  Vaping Use   Vaping Use: Never used  Substance Use Topics   Alcohol use: Not Currently   Drug use: No    Types: Marijuana    Comment: remote history    Home Medications Prior to Admission medications   Medication Sig Start Date End Date Taking? Authorizing Provider  HYDROcodone-acetaminophen (NORCO/VICODIN) 5-325 MG tablet Take 1 tablet by mouth 2 (two) times daily as needed for moderate pain.   Yes [provider]  methocarbamol (ROBAXIN-750) 750 MG tablet Take 1 tablet (750 mg total) by mouth every 6 (six) hours as needed for muscle spasms. 07/31/21  Yes Arby Barrette, MD  colchicine 0.6 MG tablet Take 1 tablet (0.6 mg total) by mouth 2 (two) times daily. 11/09/18 02/07/19  Amin, Loura Halt, MD  meclizine (ANTIVERT) 25 MG tablet Take 1 tablet (25 mg total) by mouth 3 (three)  times daily as needed for dizziness. 10/17/19   Khatri, Hina, PA-C  metoprolol succinate (TOPROL-XL) 25 MG 24 hr tablet Take 1 tablet (25 mg total) by mouth daily. Take with or immediately following a meal. 11/17/18 12/17/18  Azalee Course, PA  pantoprazole (PROTONIX) 40 MG tablet Take 1 tablet (40 mg total) by mouth daily before breakfast. 11/09/18 12/09/18  Amin, Loura Halt, MD  testosterone cypionate (DEPOTESTOTERONE CYPIONATE) 100 MG/ML injection Inject 200 mg into the muscle every 14 (fourteen) days. For IM use only    [provider]  zolpidem (AMBIEN) 10 MG tablet Take 5 mg by mouth at bedtime as needed for sleep.    [provider]    Allergies    Prednisone, Ivp dye [iodinated diagnostic agents], and Metrizamide  Review of Systems   Review of Systems 10 systems reviewed and  negative except as per HPI Physical Exam Updated Vital Signs BP 138/85   Pulse 70   Temp 97.8 F (36.6 C) (Oral)   Resp 18   Ht 6\' 1"  (1.854 m)   Wt 106.6 kg   SpO2 99%   BMI 31.00 kg/m   Physical Exam Constitutional:      Appearance: Normal appearance.  HENT:     Head: Normocephalic and atraumatic.  Eyes:     Extraocular Movements: Extraocular movements intact.  Cardiovascular:     Rate and Rhythm: Normal rate and regular rhythm.  Pulmonary:     Effort: Pulmonary effort is normal.     Breath sounds: Normal breath sounds.  Abdominal:     General: There is no distension.     Palpations: Abdomen is soft.     Tenderness: There is no abdominal tenderness. There is no guarding.  Musculoskeletal:        General: Normal range of motion.     Comments: Lower extremities are symmetric in appearance.  Well developed.  Normal skin with normal hairbearing.  Patient endorses some mild discomfort to direct compression over the anterior quadriceps bilaterally.  Soft tissues are normal without any edema or induration.  No effusions of the joints.  No warmth of the knee or the ankle.  Mild discomfort to the back with straight leg raise bilaterally.  Feet are warm and dry.  Peripheral pulses dorsalis pedis 2+ and strong and symmetric.  Femoral pulses 2+ strong symmetric.  Skin:    General: Skin is warm and dry.  Neurological:     General: No focal deficit present.     Mental Status: He is alert and oriented to person, place, and time.     Sensory: No sensory deficit.     Motor: No weakness.     Coordination: Coordination normal.  Psychiatric:        Mood and Affect: Mood normal.    ED Results / Procedures / Treatments   Labs (all labs ordered are listed, but only abnormal results are displayed) Labs Reviewed  BASIC METABOLIC PANEL - Abnormal; Notable for the following components:      Result Value   BUN 22 (*)    All other components within normal limits  CBC WITH  DIFFERENTIAL/PLATELET - Abnormal; Notable for the following components:   RBC 5.97 (*)    Hemoglobin 18.3 (*)    HCT 53.5 (*)    All other components within normal limits  MAGNESIUM  PHOSPHORUS  SEDIMENTATION RATE  CK  URINALYSIS, ROUTINE W REFLEX MICROSCOPIC  C-REACTIVE PROTEIN  TSH    EKG None  Radiology DG  Lumbar Spine Complete  Result Date: 07/31/2021 CLINICAL DATA:  Leg pain EXAM: LUMBAR SPINE - COMPLETE 4+ VIEW COMPARISON:  No comparison studies available. FINDINGS: No fracture or subluxation. Loss of disc height noted T11-12 with mild loss of disc height L4-5 and L5-S1. Facets are well aligned bilaterally. SI joints are unremarkable. IMPRESSION: No acute bony abnormality. Electronically Signed   By: Kennith Center M.D.   On: 07/31/2021 11:48    Procedures Procedures   Medications Ordered in ED Medications  ibuprofen (ADVIL) tablet 800 mg (800 mg Oral Given 07/31/21 1147)  methocarbamol (ROBAXIN) tablet 750 mg (750 mg Oral Given 07/31/21 1147)  dexamethasone (DECADRON) injection 10 mg (10 mg Intramuscular Given 07/31/21 1217)    ED Course  I have reviewed the triage vital signs and the nursing notes.  Pertinent labs & imaging results that were available during my care of the patient were reviewed by me and considered in my medical decision making (see chart for details).    MDM Rules/Calculators/A&P                           Patient presents as outlined.  He predominately is perceiving pain in the front of his legs and sometimes radiating down to the calves.  This has been going on for about a month.  Myositis unlikely with normal CK and normal sed rate.  Patient is not on any hyperlipidemic agents.  Patient has excellent femoral pulses and dorsalis pedis pulses with good capillary refill and skin condition, no signs of arterial obstruction.  Also no indication of venous thrombosis.  Patient does not have edema, calf tenderness or popliteal fossa tenderness.  No abdominal  pain.  Patient is not experiencing back pain.  He reports he occasionally has some back discomfort but not pronounced.  No pain at this time.  He reports he does have history of degenerative disease.  With work-up negative, I suspect sciatica.  Patient has intolerance of oral steroids but reports he tolerates IM steroids.  We will give a dose of Decadron.  Patient does have regular hydrocodone for pain control, and reports he is taking naproxen at home.  Will add Robaxin for muscle relaxer.  Recommend close follow-up with PCP and or orthopedics for further evaluation of lower extremity pain.  Patient may need follow-up MRI, return precautions for weakness numbness bowel or bladder dysfunction reviewed.  Patient also made aware of slight elevation in hemoglobin, this has been present in the past.  At this time I have low suspicion for polycythemia vera however patient is made aware of this value and the need for surveillance. Final Clinical Impression(s) / ED Diagnoses Final diagnoses:  Bilateral leg pain  Bilateral sciatica    Rx / DC Orders ED Discharge Orders          Ordered    methocarbamol (ROBAXIN-750) 750 MG tablet  Every 6 hours PRN        07/31/21 1216             Arby Barrette, MD 07/31/21 1225

## 2021-08-06 ENCOUNTER — Other Ambulatory Visit (HOSPITAL_COMMUNITY): Payer: Self-pay

## 2024-08-11 ENCOUNTER — Other Ambulatory Visit (HOSPITAL_COMMUNITY): Payer: Self-pay | Admitting: Orthopaedic Surgery

## 2024-08-11 ENCOUNTER — Ambulatory Visit (HOSPITAL_COMMUNITY)
Admission: RE | Admit: 2024-08-11 | Discharge: 2024-08-11 | Disposition: A | Discharge status: Home / Self Care | Source: Ambulatory Visit | Attending: Vascular Surgery | Admitting: Vascular Surgery

## 2024-08-11 DIAGNOSIS — M79605 Pain in left leg: Secondary | ICD-10-CM | POA: Diagnosis present
